# Patient Record
Sex: Female | Born: 1985 | Race: White | Hispanic: No | Marital: Married | Smoking: Never smoker
Health system: Southern US, Community
[De-identification: ages and names within clinical notes are randomized; demographics above are authoritative.]

## PROBLEM LIST (undated history)

## (undated) DIAGNOSIS — K219 Gastro-esophageal reflux disease without esophagitis: Secondary | ICD-10-CM

## (undated) DIAGNOSIS — Z87440 Personal history of urinary (tract) infections: Secondary | ICD-10-CM

## (undated) DIAGNOSIS — Z8619 Personal history of other infectious and parasitic diseases: Secondary | ICD-10-CM

## (undated) DIAGNOSIS — N6320 Unspecified lump in the left breast, unspecified quadrant: Secondary | ICD-10-CM

## (undated) HISTORY — DX: Unspecified lump in the left breast, unspecified quadrant: N63.20

## (undated) HISTORY — PX: TYMPANOSTOMY TUBE PLACEMENT: SHX32

## (undated) HISTORY — DX: Personal history of other infectious and parasitic diseases: Z86.19

## (undated) HISTORY — DX: Personal history of urinary (tract) infections: Z87.440

## (undated) HISTORY — DX: Gastro-esophageal reflux disease without esophagitis: K21.9

## (undated) HISTORY — PX: WISDOM TOOTH EXTRACTION: SHX21

---
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
## 2007-08-27 ENCOUNTER — Encounter: Admission: RE | Admit: 2007-08-27 | Discharge: 2007-08-27 | Payer: Self-pay | Admitting: Obstetrics and Gynecology

## 2008-03-17 ENCOUNTER — Encounter: Admission: RE | Admit: 2008-03-17 | Discharge: 2008-03-17 | Payer: Self-pay | Admitting: Obstetrics and Gynecology

## 2008-10-10 ENCOUNTER — Encounter: Admission: RE | Admit: 2008-10-10 | Discharge: 2008-10-10 | Payer: Self-pay | Admitting: Obstetrics and Gynecology

## 2010-09-04 ENCOUNTER — Other Ambulatory Visit: Payer: Self-pay | Admitting: Obstetrics and Gynecology

## 2010-09-10 ENCOUNTER — Other Ambulatory Visit: Payer: Self-pay

## 2010-09-12 ENCOUNTER — Other Ambulatory Visit: Payer: Self-pay

## 2010-09-26 ENCOUNTER — Ambulatory Visit
Admission: RE | Admit: 2010-09-26 | Discharge: 2010-09-26 | Disposition: A | Discharge status: Home / Self Care | Payer: BC Managed Care – PPO | Source: Ambulatory Visit | Attending: Obstetrics and Gynecology | Admitting: Obstetrics and Gynecology

## 2011-03-04 ENCOUNTER — Ambulatory Visit (INDEPENDENT_AMBULATORY_CARE_PROVIDER_SITE_OTHER): Payer: BC Managed Care – PPO | Admitting: Surgery

## 2011-03-04 ENCOUNTER — Encounter (INDEPENDENT_AMBULATORY_CARE_PROVIDER_SITE_OTHER): Payer: Self-pay | Admitting: Surgery

## 2011-03-04 DIAGNOSIS — N6320 Unspecified lump in the left breast, unspecified quadrant: Secondary | ICD-10-CM | POA: Insufficient documentation

## 2011-03-04 DIAGNOSIS — N63 Unspecified lump in unspecified breast: Secondary | ICD-10-CM

## 2011-03-04 HISTORY — DX: Unspecified lump in the left breast, unspecified quadrant: N63.20

## 2011-03-04 NOTE — Progress Notes (Signed)
Chief Complaint  Patient presents with  . Breast Mass    left breast mass - referral from Dr. Malva Limes    HISTORY: Patient is a 25 year old female followed for the past 3 years with a left breast mass. This had been noted on physical exam by her gynecologist. She has had sequential ultrasound scanning. She was evaluated at my surgical practice by one of my partners in 2010. 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  Patient notes that the mass has become gradually larger. It is becoming mildly symptomatic. She does not wish to continue ultrasound examinations. She desires surgical excision.  Patient has no prior history of breast disease. She denies nipple discharge. She has had no prior biopsies. Patient does have a sister with fibrocystic changes and fibroadenomas. Mother has had no breast disease.   No past medical history on file.   Current Outpatient Prescriptions  Medication Sig Dispense Refill  . Biotin 5 MG CAPS Take 5 mg by mouth daily.        Marland Kitchen ethynodiol-ethinyl estradiol (ZOVIA) 1-50 MG-MCG tablet Take 1 tablet by mouth daily.           No Known Allergies   Family History  Problem Relation Age of Onset  . Diabetes Maternal Grandmother   . Diabetes Paternal Grandfather   . Esophageal cancer Father      History   Social History  . Marital Status: Single    Spouse Name: N/A    Number of Children: N/A  . Years of Education: N/A   Social History Main Topics  . Smoking status: Never Smoker   . Smokeless tobacco: None  . Alcohol Use: Yes     very rarely  . Drug Use: No  . Sexually Active: None   Other Topics Concern  . None   Social History Narrative  . None     REVIEW OF SYSTEMS - PERTINENT POSITIVES ONLY: Mass is gradually enlarging. Mild discomfort. No nipple drainage.   EXAM: Filed Vitals:   03/04/11 1359  BP: 120/79  Pulse: 101    HEENT: normocephalic;  pupils equal and reactive; sclerae clear; dentition good; mucous membranes moist NECK:  No palpable nodules; symmetric on extension; no palpable anterior or posterior cervical lymphadenopathy; no supraclavicular masses; no tenderness CHEST: clear to auscultation bilaterally without rales, rhonchi, or wheezes CARDIAC: regular rate and rhythm without significant murmur; peripheral pulses are full EXT:  non-tender without edema; no deformity BREAST: Left breast shows normal nipple areolar complex. No cutaneous changes. Palpation shows the breast parenchyma to be diffusely nodular. There is a dominant mass in the upper outer quadrant measuring 3-4 cm in greatest dimension. It is mildly tender. It is mobile and discrete consistent with fibroadenoma area and right breast shows normal nipple areolar complex. The breast parenchyma is diffusely nodular without discrete or dominant mass. Bilateral axillae are without palpable adenopathy. NEURO: no gross focal deficits; no sign of tremor   LABORATORY RESULTS: See E-Chart for most recent results   RADIOLOGY RESULTS: See E-Chart or I-Site for most recent results   IMPRESSION: Left breast mass, upper outer quadrant, 3.8 cm, consistent with fibroadenoma. Mildly symptomatic.   PLAN: The patient and I and her mother discussed options for management. They have decided on surgical excision for definitive diagnosis and to avoid future diagnostic studies. We will make arrangements for outpatient surgery at [facility] at a time convenient for  the patient in the near future.  The risks and benefits of the procedure have been discussed at length with the patient.  The patient understands the proposed procedure, potential alternative treatments, and the course of recovery to be expected.  All of the patient's questions have been answered at this time.  The patient wishes to proceed with surgery and will schedule a date for their procedure through  our office staff.   Velora Heckler, MD, FACS General & Endocrine Surgery [facility]      Visit Diagnoses: 1. Breast mass, left     Primary Care Physician: No primary provider on file.

## 2011-04-30 ENCOUNTER — Encounter (INDEPENDENT_AMBULATORY_CARE_PROVIDER_SITE_OTHER): Payer: Self-pay | Admitting: Surgery

## 2011-05-09 ENCOUNTER — Ambulatory Visit (HOSPITAL_COMMUNITY): Admission: RE | Admit: 2011-05-09 | Payer: BC Managed Care – PPO | Source: Ambulatory Visit | Admitting: Surgery

## 2011-05-09 ENCOUNTER — Encounter (HOSPITAL_COMMUNITY): Admission: RE | Payer: Self-pay | Source: Ambulatory Visit

## 2011-05-09 SURGERY — BREAST BIOPSY
Anesthesia: General | Laterality: Left

## 2011-06-03 HISTORY — PX: OTHER SURGICAL HISTORY: SHX169

## 2011-06-19 ENCOUNTER — Other Ambulatory Visit (HOSPITAL_COMMUNITY)
Admission: RE | Admit: 2011-06-19 | Discharge: 2011-06-19 | Disposition: A | Discharge status: Home / Self Care | Payer: BC Managed Care – PPO | Source: Ambulatory Visit | Attending: Surgery | Admitting: Surgery

## 2011-06-19 ENCOUNTER — Other Ambulatory Visit (INDEPENDENT_AMBULATORY_CARE_PROVIDER_SITE_OTHER): Payer: Self-pay | Admitting: Surgery

## 2011-06-19 DIAGNOSIS — D249 Benign neoplasm of unspecified breast: Secondary | ICD-10-CM

## 2011-06-23 ENCOUNTER — Encounter (HOSPITAL_COMMUNITY): Admission: RE | Payer: Self-pay | Source: Ambulatory Visit

## 2011-06-23 ENCOUNTER — Ambulatory Visit (HOSPITAL_COMMUNITY): Admission: RE | Admit: 2011-06-23 | Payer: BC Managed Care – PPO | Source: Ambulatory Visit | Admitting: Surgery

## 2011-06-23 SURGERY — EXCISION OF BREAST BIOPSY
Anesthesia: General | Site: Breast

## 2011-06-25 NOTE — Progress Notes (Signed)
Results given patient aware.

## 2011-06-25 NOTE — Progress Notes (Signed)
Quick Note:  Please contact patient with benign path results. TMG ______ 

## 2011-07-01 ENCOUNTER — Encounter (INDEPENDENT_AMBULATORY_CARE_PROVIDER_SITE_OTHER): Payer: Self-pay | Admitting: Surgery

## 2011-07-01 ENCOUNTER — Ambulatory Visit (INDEPENDENT_AMBULATORY_CARE_PROVIDER_SITE_OTHER): Payer: BC Managed Care – PPO | Admitting: Surgery

## 2011-07-01 VITALS — BP 98/62 | HR 75 | Temp 97.4°F | Resp 16 | Ht 61.0 in | Wt 176.8 lb

## 2011-07-01 DIAGNOSIS — N6320 Unspecified lump in the left breast, unspecified quadrant: Secondary | ICD-10-CM

## 2011-07-01 DIAGNOSIS — N63 Unspecified lump in unspecified breast: Secondary | ICD-10-CM

## 2011-07-01 NOTE — Patient Instructions (Signed)
  COCOA BUTTER & VITAMIN E CREAM  ([employer] or other brand)  Apply cocoa butter/vitamin E cream to your incision 2 - 3 times daily.  Massage cream into incision for one minute with each application.  Use sunscreen (50 SPF or higher) for first 6 months after surgery if area is exposed to sun.  You may substitute Mederma or other scar reducing creams as desired.   

## 2011-07-01 NOTE — Progress Notes (Signed)
Visit Diagnoses: 1. Breast mass, left     HISTORY: Patient is a 26 year old white female who underwent left breast excisional biopsy for a large fibroadenoma. 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 Postoperative course has been uncomplicated.  EXAM: Surgical wound is well-healed. Remaining Steri-Strips are removed. No sign of seroma. No sign of infection.  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 She is released at full activity without restriction. She will return to see me in this office as needed.  Velora Heckler, MD, FACS General & Endocrine Surgery [facility]

## 2011-08-04 ENCOUNTER — Encounter (INDEPENDENT_AMBULATORY_CARE_PROVIDER_SITE_OTHER): Payer: BC Managed Care – PPO | Admitting: Surgery

## 2011-08-07 ENCOUNTER — Encounter (INDEPENDENT_AMBULATORY_CARE_PROVIDER_SITE_OTHER): Payer: Self-pay | Admitting: Surgery

## 2014-10-03 ENCOUNTER — Inpatient Hospital Stay (HOSPITAL_COMMUNITY): Admission: AD | Admit: 2014-10-03 | Payer: Self-pay | Source: Ambulatory Visit | Admitting: Obstetrics and Gynecology

## 2015-03-07 LAB — OB RESULTS CONSOLE HEPATITIS B SURFACE ANTIGEN: Hepatitis B Surface Ag: NEGATIVE

## 2015-03-07 LAB — OB RESULTS CONSOLE HIV ANTIBODY (ROUTINE TESTING): HIV: NONREACTIVE

## 2015-03-07 LAB — OB RESULTS CONSOLE ABO/RH: RH TYPE: POSITIVE

## 2015-03-07 LAB — OB RESULTS CONSOLE RUBELLA ANTIBODY, IGM: Rubella: IMMUNE

## 2015-03-07 LAB — OB RESULTS CONSOLE RPR: RPR: NONREACTIVE

## 2015-03-07 LAB — OB RESULTS CONSOLE GC/CHLAMYDIA
CHLAMYDIA, DNA PROBE: NEGATIVE
Gonorrhea: NEGATIVE

## 2015-03-07 LAB — OB RESULTS CONSOLE ANTIBODY SCREEN: Antibody Screen: NEGATIVE

## 2015-06-03 NOTE — L&D Delivery Note (Signed)
Pt had a prolonged labor curve. She pushed for 1 1/2 hours and had a SVD of one live viable infant over a 4th degree midline tear in the LOA position. Nuchal cord x 1. Placenta S/I. Baby came out crying. After cord was cut the baby was placed on the mother's abd. Then the baby's heart rate declined the baby became flaccid. Code Apgar was called. Placenta S/I. Tear closed with 3-0 chromic and 2-0 vicryl. Double layer closure on the rectal tear. Baby to NBN. [id number]

## 2015-09-05 LAB — OB RESULTS CONSOLE GBS: GBS: POSITIVE

## 2015-10-05 ENCOUNTER — Other Ambulatory Visit: Payer: Self-pay | Admitting: Obstetrics and Gynecology

## 2015-10-05 ENCOUNTER — Telehealth (HOSPITAL_COMMUNITY): Payer: Self-pay | Admitting: *Deleted

## 2015-10-05 NOTE — Telephone Encounter (Signed)
Preadmission screen  

## 2015-10-08 ENCOUNTER — Encounter (HOSPITAL_COMMUNITY): Payer: Self-pay | Admitting: *Deleted

## 2015-10-09 ENCOUNTER — Inpatient Hospital Stay (HOSPITAL_COMMUNITY)
Admission: RE | Admit: 2015-10-09 | Discharge: 2015-10-12 | DRG: 775 | Disposition: A | Discharge status: Home / Self Care | Payer: BLUE CROSS/BLUE SHIELD | Source: Ambulatory Visit | Attending: Obstetrics and Gynecology | Admitting: Obstetrics and Gynecology

## 2015-10-09 ENCOUNTER — Inpatient Hospital Stay (HOSPITAL_COMMUNITY): Payer: BLUE CROSS/BLUE SHIELD | Admitting: Anesthesiology

## 2015-10-09 VITALS — BP 107/71 | HR 87 | Temp 98.4°F | Resp 18 | Ht 62.0 in | Wt 208.0 lb

## 2015-10-09 DIAGNOSIS — O480 Post-term pregnancy: Secondary | ICD-10-CM | POA: Diagnosis present

## 2015-10-09 DIAGNOSIS — Z3490 Encounter for supervision of normal pregnancy, unspecified, unspecified trimester: Secondary | ICD-10-CM

## 2015-10-09 DIAGNOSIS — O99214 Obesity complicating childbirth: Secondary | ICD-10-CM | POA: Diagnosis present

## 2015-10-09 DIAGNOSIS — Z3A40 40 weeks gestation of pregnancy: Secondary | ICD-10-CM | POA: Diagnosis not present

## 2015-10-09 DIAGNOSIS — O99824 Streptococcus B carrier state complicating childbirth: Secondary | ICD-10-CM | POA: Diagnosis present

## 2015-10-09 DIAGNOSIS — E669 Obesity, unspecified: Secondary | ICD-10-CM | POA: Diagnosis present

## 2015-10-09 DIAGNOSIS — Z6838 Body mass index (BMI) 38.0-38.9, adult: Secondary | ICD-10-CM

## 2015-10-09 LAB — TYPE AND SCREEN
ABO/RH(D): O POS
Antibody Screen: NEGATIVE

## 2015-10-09 LAB — CBC
HEMATOCRIT: 38.8 % (ref 36.0–46.0)
HEMOGLOBIN: 13.7 g/dL (ref 12.0–15.0)
MCH: 31.1 pg (ref 26.0–34.0)
MCHC: 35.3 g/dL (ref 30.0–36.0)
MCV: 88.2 fL (ref 78.0–100.0)
Platelets: 309 10*3/uL (ref 150–400)
RBC: 4.4 MIL/uL (ref 3.87–5.11)
RDW: 13.4 % (ref 11.5–15.5)
WBC: 9.9 10*3/uL (ref 4.0–10.5)

## 2015-10-09 LAB — RPR: RPR Ser Ql: NONREACTIVE

## 2015-10-09 LAB — ABO/RH: ABO/RH(D): O POS

## 2015-10-09 MED ORDER — LACTATED RINGERS IV SOLN
1.0000 m[IU]/min | INTRAVENOUS | Status: DC
  Administered 2015-10-09: 2 m[IU]/min via INTRAVENOUS
  Administered 2015-10-10: 8 m[IU]/min via INTRAVENOUS
  Filled 2015-10-09 (×2): qty 4

## 2015-10-09 MED ORDER — TERBUTALINE SULFATE 1 MG/ML IJ SOLN
0.2500 mg | Freq: Once | INTRAMUSCULAR | Status: AC | PRN
  Administered 2015-10-10: 0.25 mg via SUBCUTANEOUS
  Filled 2015-10-09: qty 1

## 2015-10-09 MED ORDER — CITRIC ACID-SODIUM CITRATE 334-500 MG/5ML PO SOLN
30.0000 mL | ORAL | Status: DC | PRN
  Filled 2015-10-09: qty 15

## 2015-10-09 MED ORDER — LACTATED RINGERS IV SOLN
INTRAVENOUS | Status: DC
  Administered 2015-10-09 – 2015-10-10 (×4): via INTRAVENOUS

## 2015-10-09 MED ORDER — LIDOCAINE HCL (PF) 1 % IJ SOLN
INTRAMUSCULAR | Status: DC | PRN
  Administered 2015-10-09: 4 mL

## 2015-10-09 MED ORDER — OXYTOCIN BOLUS FROM INFUSION
500.0000 mL | INTRAVENOUS | Status: DC
Start: 2015-10-09 — End: 2015-10-10
  Administered 2015-10-10: 500 mL via INTRAVENOUS

## 2015-10-09 MED ORDER — PENICILLIN G POTASSIUM 5000000 UNITS IJ SOLR
5.0000 10*6.[IU] | Freq: Once | INTRAVENOUS | Status: AC
  Administered 2015-10-09: 5 10*6.[IU] via INTRAVENOUS
  Filled 2015-10-09: qty 5

## 2015-10-09 MED ORDER — LACTATED RINGERS IV SOLN
500.0000 mL | INTRAVENOUS | Status: DC | PRN
  Administered 2015-10-10: 500 mL via INTRAVENOUS

## 2015-10-09 MED ORDER — MISOPROSTOL 25 MCG QUARTER TABLET
25.0000 ug | ORAL_TABLET | ORAL | Status: DC | PRN
  Administered 2015-10-09: 25 ug via VAGINAL
  Filled 2015-10-09: qty 0.25
  Filled 2015-10-09: qty 1

## 2015-10-09 MED ORDER — OXYTOCIN 10 UNIT/ML IJ SOLN
2.5000 [IU]/h | INTRAVENOUS | Status: DC
  Administered 2015-10-10: 39.96 [IU]/h via INTRAVENOUS

## 2015-10-09 MED ORDER — PENICILLIN G POTASSIUM 5000000 UNITS IJ SOLR
2.5000 10*6.[IU] | INTRAVENOUS | Status: DC
  Administered 2015-10-09 – 2015-10-10 (×7): 2.5 10*6.[IU] via INTRAVENOUS
  Filled 2015-10-09 (×9): qty 2.5

## 2015-10-09 MED ORDER — DIPHENHYDRAMINE HCL 50 MG/ML IJ SOLN: 12.5000 mg | INTRAMUSCULAR | Status: DC | PRN

## 2015-10-09 MED ORDER — ONDANSETRON HCL 4 MG/2ML IJ SOLN
4.0000 mg | Freq: Four times a day (QID) | INTRAMUSCULAR | Status: DC | PRN
  Administered 2015-10-09: 4 mg via INTRAVENOUS
  Filled 2015-10-09: qty 2

## 2015-10-09 MED ORDER — FENTANYL 2.5 MCG/ML BUPIVACAINE 1/10 % EPIDURAL INFUSION (WH - ANES)
14.0000 mL/h | INTRAMUSCULAR | Status: DC | PRN
  Administered 2015-10-09 – 2015-10-10 (×5): 14 mL/h via EPIDURAL
  Filled 2015-10-09 (×4): qty 125

## 2015-10-09 MED ORDER — SODIUM CHLORIDE 0.9 % IV SOLN: 250.0000 mL | INTRAVENOUS | Status: DC | PRN

## 2015-10-09 MED ORDER — OXYCODONE-ACETAMINOPHEN 5-325 MG PO TABS
2.0000 | ORAL_TABLET | ORAL | Status: DC | PRN
  Administered 2015-10-10: 2 via ORAL
  Filled 2015-10-09: qty 2

## 2015-10-09 MED ORDER — SODIUM CHLORIDE 0.9% FLUSH: 3.0000 mL | Freq: Two times a day (BID) | INTRAVENOUS | Status: DC

## 2015-10-09 MED ORDER — PHENYLEPHRINE 40 MCG/ML (10ML) SYRINGE FOR IV PUSH (FOR BLOOD PRESSURE SUPPORT)
80.0000 ug | PREFILLED_SYRINGE | INTRAVENOUS | Status: DC | PRN
  Administered 2015-10-09: 80 ug via INTRAVENOUS
  Filled 2015-10-09: qty 5

## 2015-10-09 MED ORDER — BUPIVACAINE HCL (PF) 0.25 % IJ SOLN
INTRAMUSCULAR | Status: DC | PRN
  Administered 2015-10-09 – 2015-10-10 (×4): 4 mL via EPIDURAL

## 2015-10-09 MED ORDER — BUTORPHANOL TARTRATE 1 MG/ML IJ SOLN
1.0000 mg | INTRAMUSCULAR | Status: DC | PRN
  Administered 2015-10-09 – 2015-10-10 (×2): 1 mg via INTRAVENOUS
  Filled 2015-10-09 (×2): qty 1

## 2015-10-09 MED ORDER — SODIUM CHLORIDE 0.9% FLUSH: 3.0000 mL | INTRAVENOUS | Status: DC | PRN

## 2015-10-09 MED ORDER — FLEET ENEMA 7-19 GM/118ML RE ENEM: 1.0000 | ENEMA | RECTAL | Status: DC | PRN

## 2015-10-09 MED ORDER — ZOLPIDEM TARTRATE 5 MG PO TABS: 5.0000 mg | ORAL_TABLET | Freq: Every evening | ORAL | Status: DC | PRN

## 2015-10-09 MED ORDER — LACTATED RINGERS IV SOLN
500.0000 mL | Freq: Once | INTRAVENOUS | Status: AC
  Administered 2015-10-09: 500 mL via INTRAVENOUS

## 2015-10-09 MED ORDER — FENTANYL CITRATE (PF) 100 MCG/2ML IJ SOLN
INTRAMUSCULAR | Status: DC | PRN
  Administered 2015-10-09 – 2015-10-10 (×4): 50 ug via EPIDURAL

## 2015-10-09 MED ORDER — OXYCODONE-ACETAMINOPHEN 5-325 MG PO TABS: 1.0000 | ORAL_TABLET | ORAL | Status: DC | PRN

## 2015-10-09 MED ORDER — LIDOCAINE HCL (PF) 1 % IJ SOLN
30.0000 mL | INTRAMUSCULAR | Status: AC | PRN
  Administered 2015-10-10: 30 mL via SUBCUTANEOUS
  Filled 2015-10-09: qty 30

## 2015-10-09 MED ORDER — FENTANYL CITRATE (PF) 100 MCG/2ML IJ SOLN
INTRAMUSCULAR | Status: AC
  Filled 2015-10-09: qty 2

## 2015-10-09 MED ORDER — EPHEDRINE 5 MG/ML INJ
10.0000 mg | INTRAVENOUS | Status: DC | PRN
  Filled 2015-10-09: qty 2

## 2015-10-09 MED ORDER — ACETAMINOPHEN 325 MG PO TABS: 650.0000 mg | ORAL_TABLET | ORAL | Status: DC | PRN

## 2015-10-09 MED ORDER — PHENYLEPHRINE 40 MCG/ML (10ML) SYRINGE FOR IV PUSH (FOR BLOOD PRESSURE SUPPORT)
80.0000 ug | PREFILLED_SYRINGE | INTRAVENOUS | Status: DC | PRN
  Filled 2015-10-09: qty 10
  Filled 2015-10-09: qty 5

## 2015-10-09 MED ORDER — SODIUM BICARBONATE 8.4 % IV SOLN
INTRAVENOUS | Status: DC | PRN
  Administered 2015-10-09: 3 mL via EPIDURAL
  Administered 2015-10-10 (×2): 5 mL via EPIDURAL

## 2015-10-09 NOTE — Anesthesia Pain Management Evaluation Note (Signed)
  CRNA Pain Management Visit Note  Patient: Jacqueline Davidson, 30 y.o., female  "Hello I am a member of the anesthesia team at [facility]. We have an anesthesia team available at all times to provide care throughout the hospital, including epidural management and anesthesia for C-section. I don't know your plan for the delivery whether it a natural birth, water birth, IV sedation, nitrous supplementation, doula or epidural, but we want to meet your pain goals."   1.Was your pain managed to your expectations on prior hospitalizations?   Yes   2.What is your expectation for pain management during this hospitalization?     Epidural  3.How can we help you reach that goal?   Record the patient's initial score and the patient's pain goal.   Pain: 0  Pain Goal: 5 [facility] wants you to be able to say your pain was always managed very well.  Dorthia Tout 10/09/2015

## 2015-10-09 NOTE — Anesthesia Preprocedure Evaluation (Signed)
Anesthesia Evaluation  Patient identified by MRN, date of birth, ID band Patient awake    Reviewed: Allergy & Precautions, NPO status , Patient's Chart, lab work & pertinent test results  History of Anesthesia Complications Negative for: history of anesthetic complications  Airway Mallampati: II  TM Distance: >3 FB Neck ROM: Full    Dental no notable dental hx. (+) Dental Advisory Given   Pulmonary neg pulmonary ROS,    Pulmonary exam normal breath sounds clear to auscultation       Cardiovascular negative cardio ROS Normal cardiovascular exam Rhythm:Regular Rate:Normal     Neuro/Psych negative neurological ROS  negative psych ROS   GI/Hepatic negative GI ROS, Neg liver ROS,   Endo/Other  obesity  Renal/GU negative Renal ROS  negative genitourinary   Musculoskeletal negative musculoskeletal ROS (+)   Abdominal   Peds negative pediatric ROS (+)  Hematology negative hematology ROS (+)   Anesthesia Other Findings   Reproductive/Obstetrics (+) Pregnancy                             Anesthesia Physical Anesthesia Plan  ASA: II  Anesthesia Plan: Epidural   Post-op Pain Management:    Induction:   Airway Management Planned:   Additional Equipment:   Intra-op Plan:   Post-operative Plan:   Informed Consent: I have reviewed the patients History and Physical, chart, labs and discussed the procedure including the risks, benefits and alternatives for the proposed anesthesia with the patient or authorized representative who has indicated his/her understanding and acceptance.   Dental advisory given  Plan Discussed with: CRNA  Anesthesia Plan Comments:         Anesthesia Quick Evaluation

## 2015-10-09 NOTE — Progress Notes (Signed)
Dr Philis Pique called for an update andRN told Dr Philis Pique that pt having some pelvic and buttox pressure; sve at 1830=7/90/-2; fhr reassurring with early decels. Dr said to call when needed for delivery.

## 2015-10-09 NOTE — Anesthesia Procedure Notes (Signed)
Epidural Patient location during procedure: OB  Staffing Anesthesiologist: Turon Kilmer Performed by: anesthesiologist   Preanesthetic Checklist Completed: patient identified, site marked, surgical consent, pre-op evaluation, timeout performed, IV checked, risks and benefits discussed and monitors and equipment checked  Epidural Patient position: sitting Prep: site prepped and draped and DuraPrep Patient monitoring: continuous pulse ox and blood pressure Approach: midline Location: L3-L4 Injection technique: LOR saline  Needle:  Needle type: Tuohy  Needle gauge: 17 G Needle length: 9 cm and 9 Needle insertion depth: 7 cm Catheter type: closed end flexible Catheter size: 19 Gauge Catheter at skin depth: 11 cm Test dose: negative  Assessment Events: blood not aspirated, injection not painful, no injection resistance, negative IV test and no paresthesia  Additional Notes Patient identified. Risks/Benefits/Options discussed with patient including but not limited to bleeding, infection, nerve damage, paralysis, failed block, incomplete pain control, headache, blood pressure changes, nausea, vomiting, reactions to medication both or allergic, itching and postpartum back pain. Confirmed with bedside nurse the patient's most recent platelet count. Confirmed with patient that they are not currently taking any anticoagulation, have any bleeding history or any family history of bleeding disorders. Patient expressed understanding and wished to proceed. All questions were answered. Sterile technique was used throughout the entire procedure. Please see nursing notes for vital signs. Test dose was given through epidural catheter and negative prior to continuing to dose epidural or start infusion. Warning signs of high block given to the patient including shortness of breath, tingling/numbness in hands, complete motor block, or any concerning symptoms with instructions to call for help. Patient was  given instructions on fall risk and not to get out of bed. All questions and concerns addressed with instructions to call with any issues or inadequate analgesia.    First attempt at epidural placement with LOR at 7 but unable to pass epidural catheter out of the end of the tuohy. Resistance encountered and did not want to force catheter. Therefore, moved to space above and repeated with LOR again at 7cm. Some heme noted in catheter immediately after threading which cleared with flushing and withdrawing catheter to 11cm which is 4cm in the epidural space. Lidocaine with epinephrine used to test catheter and no change in maternal heart rate. Again negative aspiration for heme. Therefore will move forward with using this epidural catheter. Patient also reports good analgesia after placement.

## 2015-10-09 NOTE — Progress Notes (Signed)
Late entry; at [place] also told Dr Philis Pique that pt's MVU's adequate since 1600

## 2015-10-09 NOTE — H&P (Signed)
30 y.o. [redacted]w[redacted]d  G1P0 comes in for post dates induction.  Otherwise has good fetal movement and no bleeding.  Past Medical History  Diagnosis Date  . Left breast mass   . Hx of varicella     Past Surgical History  Procedure Laterality Date  . Tympanostomy tube placement  31 years old  . Left breast lumpectomy  06/03/2011    no evidence of malignancy    OB History  Gravida Para Term Preterm AB SAB TAB Ectopic Multiple Living  1             # Outcome Date GA Lbr Len/2nd Weight Sex Delivery Anes PTL Lv  1 Current               Social History   Social History  . Marital Status: Married    Spouse Name: N/A  . Number of Children: N/A  . Years of Education: N/A   Occupational History  . Not on file.   Social History Main Topics  . Smoking status: Never Smoker   . Smokeless tobacco: Never Used  . Alcohol Use: No     Comment: very rarely  . Drug Use: No  . Sexual Activity: Not on file   Other Topics Concern  . Not on file   Social History Narrative   Review of patient's allergies indicates no known allergies.    Prenatal Transfer Tool  Maternal Diabetes: No Genetic Screening: Declined Maternal Ultrasounds/Referrals: Normal Fetal Ultrasounds or other Referrals:  None Maternal Substance Abuse:  No Significant Maternal Medications:  None Significant Maternal Lab Results: Lab values include: Group B Strep positive  Other PNC: uncomplicated.    Filed Vitals:   10/09/15 0652 10/09/15 0700  BP: 129/90 128/85  Pulse: 86 81  Temp:    Resp: 17 16     Lungs/Cor:  NAD Abdomen:  soft, gravid Ex:  no cords, erythema SVE:  2/60/-2 FHTs:  120s, good STV, NST R Toco:  q 3-5   A/P   Post dates induction.  GBS POS- PCN.  Maliek Schellhorn A

## 2015-10-10 ENCOUNTER — Encounter (HOSPITAL_COMMUNITY): Payer: Self-pay

## 2015-10-10 DIAGNOSIS — Z3490 Encounter for supervision of normal pregnancy, unspecified, unspecified trimester: Secondary | ICD-10-CM

## 2015-10-10 MED ORDER — DOCUSATE SODIUM 100 MG PO CAPS
100.0000 mg | ORAL_CAPSULE | Freq: Two times a day (BID) | ORAL | Status: DC
  Administered 2015-10-10 – 2015-10-11 (×3): 100 mg via ORAL
  Filled 2015-10-10 (×3): qty 1

## 2015-10-10 MED ORDER — ONDANSETRON HCL 4 MG PO TABS: 4.0000 mg | ORAL_TABLET | ORAL | Status: DC | PRN

## 2015-10-10 MED ORDER — SENNOSIDES-DOCUSATE SODIUM 8.6-50 MG PO TABS: 2.0000 | ORAL_TABLET | ORAL | Status: DC

## 2015-10-10 MED ORDER — TETANUS-DIPHTH-ACELL PERTUSSIS 5-2.5-18.5 LF-MCG/0.5 IM SUSP: 0.5000 mL | Freq: Once | INTRAMUSCULAR | Status: DC

## 2015-10-10 MED ORDER — ACETAMINOPHEN 325 MG PO TABS: 650.0000 mg | ORAL_TABLET | ORAL | Status: DC | PRN

## 2015-10-10 MED ORDER — DIBUCAINE 1 % RE OINT: 1.0000 "application " | TOPICAL_OINTMENT | RECTAL | Status: DC | PRN

## 2015-10-10 MED ORDER — IBUPROFEN 600 MG PO TABS
600.0000 mg | ORAL_TABLET | Freq: Four times a day (QID) | ORAL | Status: DC
Start: 2015-10-10 — End: 2015-10-12
  Administered 2015-10-10 – 2015-10-11 (×6): 600 mg via ORAL
  Filled 2015-10-10 (×7): qty 1

## 2015-10-10 MED ORDER — ONDANSETRON HCL 4 MG/2ML IJ SOLN: 4.0000 mg | INTRAMUSCULAR | Status: DC | PRN

## 2015-10-10 MED ORDER — COCONUT OIL OIL: 1.0000 "application " | TOPICAL_OIL | Status: DC | PRN

## 2015-10-10 MED ORDER — ZOLPIDEM TARTRATE 5 MG PO TABS: 5.0000 mg | ORAL_TABLET | Freq: Every evening | ORAL | Status: DC | PRN

## 2015-10-10 MED ORDER — FENTANYL CITRATE (PF) 100 MCG/2ML IJ SOLN
INTRAMUSCULAR | Status: AC
  Filled 2015-10-10: qty 2

## 2015-10-10 MED ORDER — MEASLES, MUMPS & RUBELLA VAC ~~LOC~~ INJ: 0.5000 mL | INJECTION | Freq: Once | SUBCUTANEOUS | Status: DC

## 2015-10-10 MED ORDER — SIMETHICONE 80 MG PO CHEW: 80.0000 mg | CHEWABLE_TABLET | ORAL | Status: DC | PRN

## 2015-10-10 MED ORDER — WITCH HAZEL-GLYCERIN EX PADS: 1.0000 "application " | MEDICATED_PAD | CUTANEOUS | Status: DC | PRN

## 2015-10-10 MED ORDER — PRENATAL MULTIVITAMIN CH: 1.0000 | ORAL_TABLET | Freq: Every day | ORAL | Status: DC

## 2015-10-10 MED ORDER — BENZOCAINE-MENTHOL 20-0.5 % EX AERO: 1.0000 "application " | INHALATION_SPRAY | CUTANEOUS | Status: DC | PRN

## 2015-10-10 MED ORDER — DIPHENHYDRAMINE HCL 25 MG PO CAPS: 25.0000 mg | ORAL_CAPSULE | Freq: Four times a day (QID) | ORAL | Status: DC | PRN

## 2015-10-10 MED ORDER — BENZOCAINE-MENTHOL 20-0.5 % EX AERO
1.0000 "application " | INHALATION_SPRAY | CUTANEOUS | Status: DC | PRN
  Administered 2015-10-10: 1 via TOPICAL
  Filled 2015-10-10: qty 56

## 2015-10-10 MED ORDER — IBUPROFEN 600 MG PO TABS
600.0000 mg | ORAL_TABLET | Freq: Four times a day (QID) | ORAL | Status: DC
  Administered 2015-10-10: 600 mg via ORAL
  Filled 2015-10-10: qty 1

## 2015-10-10 NOTE — Lactation Note (Addendum)
This note was copied from a baby's chart. Lactation Consultation Note Initial visit at 9 hours of age.  Baby has not had a feedings and delivery was complicated with a code apgar and PPV.  Mom will have to return to work in 65 weeks and feels she will not be able to pump at work.  Mom wants to begin pumping and bottle feeding and is ok with giving formula as well.  Mom is aware she isn't likely to express a lot of colostrum now and is ok with working on latching.  Mom is aware of difficulties her sister had with a nipple shield and feels it would be to frustrating to her to use.  Mom had been told by RN she may need to use a nipple shield.  Mom has large full breast with compressible breasts, but semi flat nipples with short shaft.  Mom is able to express a few drops with hand expression.  LC allowed baby to suck on gloved finger and was not very coordinated with sucking, but showing feeding cues.  Baby chomps and bites on finger.  Baby place STS with mom and applied several drops of EBM to baby's mouth with gloved finger.  Baby assisted with cross cradle hold and baby latched well and soon began a more rhythmic sucking pattern with swallows audible.  Mom denies pain with latch. Minimal stimulation needed to maintain feeding over 15 minutes.  Moms nipple is round and slightly everted after feeding.  Discussed with mom pump use every 3 hours on initiation phase for 15 min followed by hand expression.  RN at bedside to assess mom and will assist with DEBP set up.  Mom may begin supplementing with formula in a bottle soon but was engaging in discussion and encouragement to offer exclusive breastfeeding and delaying supplementation and artificial nipples. [facility] LC resources given and discussed.  Encouraged to feed with early cues on demand.  Early newborn behavior discussed. Mom to call for assist as needed.  Mom has a history of left breast mass removed about 6 years ago.  Scar is approx 1 and 1/2 inches long on upper  outer aspect of breast about 1 inch from areaola.  Mom denies any complications with this.      Patient Name: Jacqueline Davidson [id number] Date: 10/10/2015 Reason for consult: Initial assessment   Maternal Data Has patient been taught Hand Expression?: Yes Does the patient have breastfeeding experience prior to this delivery?: No  Feeding Feeding Type: Breast Fed Length of feed: 15 min  LATCH Score/Interventions Latch: Repeated attempts needed to sustain latch, nipple held in mouth throughout feeding, stimulation needed to elicit sucking reflex. Intervention(s): Adjust position;Assist with latch;Breast massage;Breast compression  Audible Swallowing: Spontaneous and intermittent  Type of Nipple: Flat (short shaft) Intervention(s): Double electric pump  Comfort (Breast/Nipple): Soft / non-tender     Hold (Positioning): Assistance needed to correctly position infant at breast and maintain latch. Intervention(s): Breastfeeding basics reviewed;Support Pillows;Position options;Skin to skin  LATCH Score: 7  Lactation Tools Discussed/Used WIC Program: No Pump Review: Milk Storage Date initiated:: 10/10/15   Consult Status Consult Status: Follow-up Date: 10/11/15 Follow-up type: In-patient    Justice Britain 10/10/2015, 6:25 PM

## 2015-10-10 NOTE — Progress Notes (Signed)
Pt has had occ mild to moderate variables but mostly earlies with good STV and accels.  +Scalp stim. Now C/C/+1 and pushing.

## 2015-10-10 NOTE — Progress Notes (Signed)
Dr. Ouida Sills informed that patient had a Foley catheter post 4th degree repair and that the patient's urine is blood-tinged and concentrated. Dr. Ouida Sills stated that the Foley catheter can be removed at this time.

## 2015-10-10 NOTE — Anesthesia Postprocedure Evaluation (Signed)
Anesthesia Post Note  Patient: Jacqueline Davidson  Procedure(s) Performed: * No procedures listed *  Patient location during evaluation: Mother Baby Anesthesia Type: Epidural Level of consciousness: awake, awake and alert, oriented and patient uncooperative Pain management: pain level controlled Vital Signs Assessment: post-procedure vital signs reviewed and stable Respiratory status: spontaneous breathing, nonlabored ventilation and respiratory function stable Cardiovascular status: stable Postop Assessment: no headache, no backache, patient able to bend at knees and no signs of nausea or vomiting Anesthetic complications: no     Last Vitals:  Filed Vitals:   10/10/15 1130 10/10/15 1206  BP: 117/97 121/72  Pulse: 82 86  Temp:  36.9 C  Resp: 18 16    Last Pain:  Filed Vitals:   10/10/15 1209  PainSc: 6    Pain Goal: Patients Stated Pain Goal: 4 (10/10/15 1207)               Romaldo Saville L

## 2015-10-11 ENCOUNTER — Encounter (HOSPITAL_COMMUNITY): Payer: Self-pay

## 2015-10-11 LAB — CBC
HCT: 29 % — ABNORMAL LOW (ref 36.0–46.0)
Hemoglobin: 10 g/dL — ABNORMAL LOW (ref 12.0–15.0)
MCH: 30.8 pg (ref 26.0–34.0)
MCHC: 34.5 g/dL (ref 30.0–36.0)
MCV: 89.2 fL (ref 78.0–100.0)
PLATELETS: 256 10*3/uL (ref 150–400)
RBC: 3.25 MIL/uL — ABNORMAL LOW (ref 3.87–5.11)
RDW: 14 % (ref 11.5–15.5)
WBC: 11.5 10*3/uL — AB (ref 4.0–10.5)

## 2015-10-11 NOTE — Progress Notes (Signed)
Parents escorted to NICU upon admission to NICU with Neo.

## 2015-10-11 NOTE — Lactation Note (Signed)
This note was copied from a baby's chart. Lactation Consultation Note  Follow up visit made to assist with latch.  Mom does not want to breastfeed this feeding.  Attempted to bottle feed baby with a slow flow nipple and special needs feeder.  Baby would not suck on nipple and let formula run out of her mouth.  Baby would suck on gloved finger with much effort.  Attempted to finger feed baby and baby would suck weakly but very few swallows.  Poor seal on finger.  Baby took 5 mls of formula and then spit.  Mom states baby also spit after AM feeding.  Report given to [facility] RN and Designer, industrial/product.  Patient Name: Jacqueline Davidson [id number] Date: 10/11/2015 Reason for consult: Follow-up assessment;Difficult latch   Maternal Data    Feeding Feeding Type: Breast Fed  LATCH Score/Interventions Latch: Too sleepy or reluctant, no latch achieved, no sucking elicited. Intervention(s): Skin to skin;Teach feeding cues;Waking techniques Intervention(s): Breast compression;Breast massage;Assist with latch;Adjust position  Audible Swallowing: None  Type of Nipple: Everted at rest and after stimulation  Comfort (Breast/Nipple): Soft / non-tender     Hold (Positioning): Assistance needed to correctly position infant at breast and maintain latch. Intervention(s): Breastfeeding basics reviewed;Support Pillows;Position options;Skin to skin  LATCH Score: 5  Lactation Tools Discussed/Used     Consult Status Consult Status: Follow-up Date: 10/12/15 Follow-up type: In-patient    Ave Filter 10/11/2015, 2:38 PM

## 2015-10-11 NOTE — Lactation Note (Signed)
This note was copied from a baby's chart. Lactation Consultation Note  Follow up visit made.  Baby has not been latching and having a difficult time with sucking per bottle or finger.  Mom just pumped 3 mls of colostrum.  Attempted latching baby to breast.  Mom has a short nipple and areola difficult to compress.  Baby unable to latch with several attempts.  20 mm nipple shield applied and prefilled with formula.  Baby held nipple shield in her mouth and fell asleep.  Baby did start sucking well on gloved finger after a few minutes and took 3 mls of colostrum and 8 mls of formula.  Baby will be started under double phototherapy. Mom will pump breasts every 2 hours.  I will assist with next feeding.  Patient Name: Jacqueline Davidson [id number] Date: 10/11/2015 Reason for consult: Follow-up assessment   Maternal Data    Feeding Feeding Type: Breast Fed  LATCH Score/Interventions Latch: Too sleepy or reluctant, no latch achieved, no sucking elicited. Intervention(s): Skin to skin;Teach feeding cues;Waking techniques Intervention(s): Breast compression;Breast massage;Assist with latch;Adjust position  Audible Swallowing: None  Type of Nipple: Everted at rest and after stimulation  Comfort (Breast/Nipple): Soft / non-tender     Hold (Positioning): Assistance needed to correctly position infant at breast and maintain latch. Intervention(s): Breastfeeding basics reviewed;Support Pillows;Position options;Skin to skin  LATCH Score: 5  Lactation Tools Discussed/Used     Consult Status Consult Status: Follow-up Date: 10/12/15 Follow-up type: In-patient    Ave Filter 10/11/2015, 11:29 AM

## 2015-10-11 NOTE — Progress Notes (Signed)
Post Partum Day 1 Subjective: up ad lib, voiding and tolerating PO. Complaining of feeling sore and uncomfortable  Objective: Blood pressure 137/84, pulse 86, temperature 98.1 F (36.7 C), temperature source Oral, resp. rate 18, height 5\' 2"  (1.575 m), weight 94.348 kg (208 lb), last menstrual period 12/27/2014, SpO2 98 %, unknown if currently breastfeeding.  Physical Exam:  General: alert, cooperative and appears stated age 30: appropriate Uterine Fundus: firm   Recent Labs  10/09/15 0050 10/11/15 0539  HGB 13.7 10.0*  HCT 38.8 29.0*    Assessment/Plan: Plan for discharge tomorrow and Breastfeeding   LOS: 2 days   Jacqueline Davidson H. 10/11/2015, 8:08 PM

## 2015-10-11 NOTE — Discharge Summary (Signed)
Obstetric Discharge Summary Reason for Admission: induction of labor Prenatal Procedures: NST Intrapartum Procedures: spontaneous vaginal delivery Postpartum Procedures: none Complications-Operative and Postpartum: 4th degree perineal laceration HEMOGLOBIN  Date Value Ref Range Status  10/11/2015 10.0* 12.0 - 15.0 g/dL Final   HCT  Date Value Ref Range Status  10/11/2015 29.0* 36.0 - 46.0 % Final    Discharge Diagnoses: Term Pregnancy-delivered  Discharge Information: Date: 10/11/2015 Activity: pelvic rest Diet: routine Medications: Ibuprofen, Colace and Iron Condition: stable Instructions: refer to practice specific booklet Discharge to: home Follow-up Information    Follow up with Olga Millers, MD In 4 weeks.   Specialty:  Obstetrics and Gynecology   Contact information:   [place] [id number] [phone]       Newborn Data: Live born female  Birth Weight: 8 lb 9 oz (3884 g) APGAR: 1, 7  Home with mother.  Leauna Sharber A 10/11/2015, 9:59 PM

## 2015-10-12 MED ORDER — OXYCODONE-ACETAMINOPHEN 5-325 MG PO TABS: 1.0000 | ORAL_TABLET | ORAL | Status: DC | PRN

## 2015-10-12 NOTE — Progress Notes (Signed)
Patient is eating, ambulating, voiding.  Pain control is good.  Filed Vitals:   10/10/15 1834 10/11/15 0558 10/11/15 1747 10/12/15 0500  BP: 115/84 118/75 137/84 107/71  Pulse: 82 84 86 87  Temp: 98.2 F (36.8 C) 97.6 F (36.4 C) 98.1 F (36.7 C) 98.4 F (36.9 C)  TempSrc: Oral Oral Oral Oral  Resp: 18 18 18 18   Height:      Weight:      SpO2: 98%   98%    Fundus firm Perineum without swelling.  Lab Results  Component Value Date   WBC 11.5* 10/11/2015   HGB 10.0* 10/11/2015   HCT 29.0* 10/11/2015   MCV 89.2 10/11/2015   PLT 256 10/11/2015    --/--/O POS, O POS (05/09 0050)/RI  A/P Post partum day 2.  Routine care.  Expect d/c today.    Dariane Natzke A

## 2015-10-12 NOTE — Plan of Care (Signed)
Problem: Coping: Goal: Ability to cope will improve Outcome: Progressing NICU baby

## 2015-10-14 ENCOUNTER — Ambulatory Visit: Payer: Self-pay

## 2015-10-14 NOTE — Lactation Note (Signed)
This note was copied from a baby's chart. Lactation Consultation Note  Baby is in NICU and four days old.  Baby has been taking good volumes from bottle.  Mom's milk is coming in and she is pumping 30-40 mls.  Mom would like assist putting baby to breast.  Baby awake and showing feeding cues.  Assisted with positioning baby in football hold on right breast.  Baby opens wide and latches but slips off.  20 mm nipple shield applied and baby latched well and sustained latch.  Baby nursed actively with many audible swallows.  Breastfeeding basics reviewed and questions answered.  Lactation outpatient services and support reviewed and encouraged.  Patient Name: Girl Mahati Harvan [id number] Date: 10/14/2015 Reason for consult: Follow-up assessment;NICU baby   Maternal Data    Feeding Feeding Type: Breast Fed Nipple Type: Slow - flow Length of feed: 20 min  LATCH Score/Interventions Latch: Grasps breast easily, tongue down, lips flanged, rhythmical sucking. Intervention(s): Breast compression;Breast massage;Assist with latch;Adjust position  Audible Swallowing: Spontaneous and intermittent  Type of Nipple: Everted at rest and after stimulation  Comfort (Breast/Nipple): Soft / non-tender     Hold (Positioning): Assistance needed to correctly position infant at breast and maintain latch. Intervention(s): Breastfeeding basics reviewed;Support Pillows;Position options  LATCH Score: 9  Lactation Tools Discussed/Used     Consult Status      Ave Filter 10/14/2015, 4:03 PM

## 2015-11-07 ENCOUNTER — Other Ambulatory Visit: Payer: Self-pay | Admitting: Obstetrics and Gynecology

## 2015-11-12 LAB — CYTOLOGY - PAP

## 2017-09-01 DIAGNOSIS — Z319 Encounter for procreative management, unspecified: Secondary | ICD-10-CM | POA: Diagnosis not present

## 2017-11-05 DIAGNOSIS — N926 Irregular menstruation, unspecified: Secondary | ICD-10-CM | POA: Diagnosis not present

## 2017-11-16 DIAGNOSIS — Z3201 Encounter for pregnancy test, result positive: Secondary | ICD-10-CM | POA: Diagnosis not present

## 2017-11-18 DIAGNOSIS — N925 Other specified irregular menstruation: Secondary | ICD-10-CM | POA: Diagnosis not present

## 2017-12-09 DIAGNOSIS — O26849 Uterine size-date discrepancy, unspecified trimester: Secondary | ICD-10-CM | POA: Diagnosis not present

## 2017-12-09 DIAGNOSIS — Z369 Encounter for antenatal screening, unspecified: Secondary | ICD-10-CM | POA: Diagnosis not present

## 2017-12-09 DIAGNOSIS — Z3480 Encounter for supervision of other normal pregnancy, unspecified trimester: Secondary | ICD-10-CM | POA: Diagnosis not present

## 2017-12-09 DIAGNOSIS — Z113 Encounter for screening for infections with a predominantly sexual mode of transmission: Secondary | ICD-10-CM | POA: Diagnosis not present

## 2017-12-30 DIAGNOSIS — O3680X9 Pregnancy with inconclusive fetal viability, other fetus: Secondary | ICD-10-CM | POA: Diagnosis not present

## 2017-12-30 DIAGNOSIS — Z3480 Encounter for supervision of other normal pregnancy, unspecified trimester: Secondary | ICD-10-CM | POA: Diagnosis not present

## 2017-12-30 LAB — OB RESULTS CONSOLE HIV ANTIBODY (ROUTINE TESTING): HIV: NONREACTIVE

## 2017-12-30 LAB — OB RESULTS CONSOLE ANTIBODY SCREEN: ANTIBODY SCREEN: NEGATIVE

## 2017-12-30 LAB — OB RESULTS CONSOLE GC/CHLAMYDIA
CHLAMYDIA, DNA PROBE: NEGATIVE
Gonorrhea: NEGATIVE

## 2017-12-30 LAB — OB RESULTS CONSOLE RUBELLA ANTIBODY, IGM: RUBELLA: IMMUNE

## 2017-12-30 LAB — OB RESULTS CONSOLE RPR: RPR: NONREACTIVE

## 2017-12-30 LAB — OB RESULTS CONSOLE HEPATITIS B SURFACE ANTIGEN: HEP B S AG: NEGATIVE

## 2018-01-26 DIAGNOSIS — Z369 Encounter for antenatal screening, unspecified: Secondary | ICD-10-CM | POA: Diagnosis not present

## 2018-02-23 DIAGNOSIS — Z369 Encounter for antenatal screening, unspecified: Secondary | ICD-10-CM | POA: Diagnosis not present

## 2018-02-23 DIAGNOSIS — Z3482 Encounter for supervision of other normal pregnancy, second trimester: Secondary | ICD-10-CM | POA: Diagnosis not present

## 2018-03-23 DIAGNOSIS — O99212 Obesity complicating pregnancy, second trimester: Secondary | ICD-10-CM | POA: Diagnosis not present

## 2018-04-15 DIAGNOSIS — Z713 Dietary counseling and surveillance: Secondary | ICD-10-CM | POA: Diagnosis not present

## 2018-04-20 DIAGNOSIS — Z369 Encounter for antenatal screening, unspecified: Secondary | ICD-10-CM | POA: Diagnosis not present

## 2018-05-10 DIAGNOSIS — Z3480 Encounter for supervision of other normal pregnancy, unspecified trimester: Secondary | ICD-10-CM | POA: Diagnosis not present

## 2018-05-10 DIAGNOSIS — Z23 Encounter for immunization: Secondary | ICD-10-CM | POA: Diagnosis not present

## 2018-05-13 DIAGNOSIS — O99810 Abnormal glucose complicating pregnancy: Secondary | ICD-10-CM | POA: Diagnosis not present

## 2018-05-27 DIAGNOSIS — Z369 Encounter for antenatal screening, unspecified: Secondary | ICD-10-CM | POA: Diagnosis not present

## 2018-06-09 DIAGNOSIS — J029 Acute pharyngitis, unspecified: Secondary | ICD-10-CM | POA: Diagnosis not present

## 2018-06-09 DIAGNOSIS — J111 Influenza due to unidentified influenza virus with other respiratory manifestations: Secondary | ICD-10-CM | POA: Diagnosis not present

## 2018-06-17 DIAGNOSIS — Z369 Encounter for antenatal screening, unspecified: Secondary | ICD-10-CM | POA: Diagnosis not present

## 2018-07-02 DIAGNOSIS — O139 Gestational [pregnancy-induced] hypertension without significant proteinuria, unspecified trimester: Secondary | ICD-10-CM | POA: Diagnosis not present

## 2018-07-02 DIAGNOSIS — Z3480 Encounter for supervision of other normal pregnancy, unspecified trimester: Secondary | ICD-10-CM | POA: Diagnosis not present

## 2018-07-02 DIAGNOSIS — Z369 Encounter for antenatal screening, unspecified: Secondary | ICD-10-CM | POA: Diagnosis not present

## 2018-07-02 LAB — OB RESULTS CONSOLE GBS: GBS: POSITIVE

## 2018-07-05 DIAGNOSIS — O26843 Uterine size-date discrepancy, third trimester: Secondary | ICD-10-CM | POA: Diagnosis not present

## 2018-07-10 ENCOUNTER — Inpatient Hospital Stay (HOSPITAL_COMMUNITY)
Admission: AD | Admit: 2018-07-10 | Payer: BLUE CROSS/BLUE SHIELD | Source: Home / Self Care | Admitting: Obstetrics and Gynecology

## 2018-07-14 DIAGNOSIS — Z369 Encounter for antenatal screening, unspecified: Secondary | ICD-10-CM | POA: Diagnosis not present

## 2018-07-21 DIAGNOSIS — Z369 Encounter for antenatal screening, unspecified: Secondary | ICD-10-CM | POA: Diagnosis not present

## 2018-07-26 ENCOUNTER — Encounter (HOSPITAL_COMMUNITY): Payer: Self-pay | Admitting: *Deleted

## 2018-07-26 ENCOUNTER — Telehealth (HOSPITAL_COMMUNITY): Payer: Self-pay | Admitting: *Deleted

## 2018-07-26 DIAGNOSIS — Z369 Encounter for antenatal screening, unspecified: Secondary | ICD-10-CM | POA: Diagnosis not present

## 2018-07-26 NOTE — Telephone Encounter (Signed)
Preadmission screen  

## 2018-07-27 ENCOUNTER — Inpatient Hospital Stay (HOSPITAL_COMMUNITY): Payer: BLUE CROSS/BLUE SHIELD | Admitting: Anesthesiology

## 2018-07-27 ENCOUNTER — Encounter (HOSPITAL_COMMUNITY)
Admission: AD | Disposition: A | Discharge status: Home / Self Care | Payer: Self-pay | Source: Home / Self Care | Attending: Obstetrics and Gynecology

## 2018-07-27 ENCOUNTER — Inpatient Hospital Stay (HOSPITAL_COMMUNITY)
Admission: AD | Admit: 2018-07-27 | Discharge: 2018-07-29 | DRG: 788 | Disposition: A | Discharge status: Home / Self Care | Payer: BLUE CROSS/BLUE SHIELD | Attending: Obstetrics and Gynecology | Admitting: Obstetrics and Gynecology

## 2018-07-27 ENCOUNTER — Encounter (HOSPITAL_COMMUNITY): Payer: Self-pay | Admitting: *Deleted

## 2018-07-27 DIAGNOSIS — O328XX0 Maternal care for other malpresentation of fetus, not applicable or unspecified: Secondary | ICD-10-CM | POA: Diagnosis present

## 2018-07-27 DIAGNOSIS — Z3A39 39 weeks gestation of pregnancy: Secondary | ICD-10-CM | POA: Diagnosis not present

## 2018-07-27 DIAGNOSIS — O26893 Other specified pregnancy related conditions, third trimester: Secondary | ICD-10-CM | POA: Diagnosis not present

## 2018-07-27 DIAGNOSIS — D252 Subserosal leiomyoma of uterus: Secondary | ICD-10-CM | POA: Diagnosis not present

## 2018-07-27 DIAGNOSIS — O3413 Maternal care for benign tumor of corpus uteri, third trimester: Secondary | ICD-10-CM | POA: Diagnosis not present

## 2018-07-27 DIAGNOSIS — O3663X0 Maternal care for excessive fetal growth, third trimester, not applicable or unspecified: Secondary | ICD-10-CM | POA: Diagnosis not present

## 2018-07-27 DIAGNOSIS — O99824 Streptococcus B carrier state complicating childbirth: Secondary | ICD-10-CM | POA: Diagnosis present

## 2018-07-27 DIAGNOSIS — O99892 Other specified diseases and conditions complicating childbirth: Secondary | ICD-10-CM

## 2018-07-27 DIAGNOSIS — O321XX1 Maternal care for breech presentation, fetus 1: Secondary | ICD-10-CM | POA: Diagnosis not present

## 2018-07-27 SURGERY — Surgical Case
Anesthesia: Regional | Wound class: Clean Contaminated

## 2018-07-27 MED ORDER — FENTANYL CITRATE (PF) 250 MCG/5ML IJ SOLN
INTRAMUSCULAR | Status: AC
  Filled 2018-07-27: qty 5

## 2018-07-27 MED ORDER — DIPHENHYDRAMINE HCL 25 MG PO CAPS: 25.0000 mg | ORAL_CAPSULE | Freq: Four times a day (QID) | ORAL | Status: DC | PRN

## 2018-07-27 MED ORDER — ONDANSETRON HCL 4 MG/2ML IJ SOLN
INTRAMUSCULAR | Status: AC
  Filled 2018-07-27: qty 2

## 2018-07-27 MED ORDER — LACTATED RINGERS IV SOLN
INTRAVENOUS | Status: DC
  Administered 2018-07-27 – 2018-07-28 (×2): via INTRAVENOUS

## 2018-07-27 MED ORDER — HYDROMORPHONE HCL 1 MG/ML IJ SOLN: 0.2000 mg | INTRAMUSCULAR | Status: DC | PRN

## 2018-07-27 MED ORDER — DIBUCAINE 1 % RE OINT: 1.0000 "application " | TOPICAL_OINTMENT | RECTAL | Status: DC | PRN

## 2018-07-27 MED ORDER — MIDAZOLAM HCL 2 MG/2ML IJ SOLN
INTRAMUSCULAR | Status: DC | PRN
  Administered 2018-07-27: 2 mg via INTRAVENOUS

## 2018-07-27 MED ORDER — METHYLERGONOVINE MALEATE 0.2 MG PO TABS: 0.2000 mg | ORAL_TABLET | ORAL | Status: DC | PRN

## 2018-07-27 MED ORDER — ONDANSETRON HCL 4 MG/2ML IJ SOLN
INTRAMUSCULAR | Status: DC | PRN
  Administered 2018-07-27: 4 mg via INTRAVENOUS

## 2018-07-27 MED ORDER — DIPHENHYDRAMINE HCL 50 MG/ML IJ SOLN: 12.5000 mg | Freq: Four times a day (QID) | INTRAMUSCULAR | Status: DC | PRN

## 2018-07-27 MED ORDER — CEFAZOLIN SODIUM-DEXTROSE 2-3 GM-%(50ML) IV SOLR
INTRAVENOUS | Status: DC | PRN
  Administered 2018-07-27: 2 g via INTRAVENOUS

## 2018-07-27 MED ORDER — SUCCINYLCHOLINE CHLORIDE 200 MG/10ML IV SOSY
PREFILLED_SYRINGE | INTRAVENOUS | Status: DC | PRN
  Administered 2018-07-27: 120 mg via INTRAVENOUS

## 2018-07-27 MED ORDER — METHYLERGONOVINE MALEATE 0.2 MG/ML IJ SOLN: 0.2000 mg | INTRAMUSCULAR | Status: DC | PRN

## 2018-07-27 MED ORDER — SIMETHICONE 80 MG PO CHEW
80.0000 mg | CHEWABLE_TABLET | ORAL | Status: DC
  Administered 2018-07-27 – 2018-07-29 (×2): 80 mg via ORAL
  Filled 2018-07-27 (×2): qty 1

## 2018-07-27 MED ORDER — HYDROMORPHONE 1 MG/ML IV SOLN
INTRAVENOUS | Status: DC
  Administered 2018-07-27: 1.1 mg via INTRAVENOUS
  Administered 2018-07-27: 0.3 mg via INTRAVENOUS
  Administered 2018-07-27: 14:00:00 via INTRAVENOUS
  Administered 2018-07-28: 0 mg via INTRAVENOUS
  Administered 2018-07-28: 0.3 mg via INTRAVENOUS
  Administered 2018-07-28: 0.386 mg via INTRAVENOUS
  Filled 2018-07-27: qty 25

## 2018-07-27 MED ORDER — PROPOFOL 10 MG/ML IV BOLUS
INTRAVENOUS | Status: DC | PRN
  Administered 2018-07-27: 200 mg via INTRAVENOUS

## 2018-07-27 MED ORDER — DEXAMETHASONE SODIUM PHOSPHATE 10 MG/ML IJ SOLN
INTRAMUSCULAR | Status: DC | PRN
  Administered 2018-07-27: 10 mg via INTRAVENOUS

## 2018-07-27 MED ORDER — ONDANSETRON HCL 4 MG/2ML IJ SOLN: 4.0000 mg | Freq: Four times a day (QID) | INTRAMUSCULAR | Status: DC | PRN

## 2018-07-27 MED ORDER — FENTANYL CITRATE (PF) 100 MCG/2ML IJ SOLN
INTRAMUSCULAR | Status: AC
  Filled 2018-07-27: qty 2

## 2018-07-27 MED ORDER — NALOXONE HCL 0.4 MG/ML IJ SOLN: 0.4000 mg | INTRAMUSCULAR | Status: DC | PRN

## 2018-07-27 MED ORDER — OXYTOCIN 10 UNIT/ML IJ SOLN
INTRAMUSCULAR | Status: DC | PRN
  Administered 2018-07-27: 40 [IU] via INTRAVENOUS

## 2018-07-27 MED ORDER — ACETAMINOPHEN 500 MG PO TABS
1000.0000 mg | ORAL_TABLET | Freq: Four times a day (QID) | ORAL | Status: DC
  Administered 2018-07-27 – 2018-07-29 (×7): 1000 mg via ORAL
  Filled 2018-07-27 (×8): qty 2

## 2018-07-27 MED ORDER — OXYCODONE HCL 5 MG/5ML PO SOLN: 5.0000 mg | Freq: Once | ORAL | Status: DC | PRN

## 2018-07-27 MED ORDER — FENTANYL CITRATE (PF) 100 MCG/2ML IJ SOLN
25.0000 ug | INTRAMUSCULAR | Status: DC | PRN
  Administered 2018-07-27 (×2): 50 ug via INTRAVENOUS

## 2018-07-27 MED ORDER — SIMETHICONE 80 MG PO CHEW
80.0000 mg | CHEWABLE_TABLET | Freq: Three times a day (TID) | ORAL | Status: DC
  Administered 2018-07-27 – 2018-07-29 (×5): 80 mg via ORAL
  Filled 2018-07-27 (×5): qty 1

## 2018-07-27 MED ORDER — METHYLERGONOVINE MALEATE 0.2 MG PO TABS
ORAL_TABLET | ORAL | Status: AC
  Filled 2018-07-27: qty 1

## 2018-07-27 MED ORDER — COCONUT OIL OIL: 1.0000 "application " | TOPICAL_OIL | Status: DC | PRN

## 2018-07-27 MED ORDER — OXYTOCIN 40 UNITS IN NORMAL SALINE INFUSION - SIMPLE MED: 2.5000 [IU]/h | INTRAVENOUS | Status: AC

## 2018-07-27 MED ORDER — METHYLERGONOVINE MALEATE 0.2 MG PO TABS
0.2000 mg | ORAL_TABLET | ORAL | Status: DC | PRN
  Administered 2018-07-27: 0.2 mg via ORAL

## 2018-07-27 MED ORDER — OXYCODONE HCL 5 MG PO TABS: 5.0000 mg | ORAL_TABLET | Freq: Once | ORAL | Status: DC | PRN

## 2018-07-27 MED ORDER — LACTATED RINGERS IV SOLN
INTRAVENOUS | Status: DC | PRN
  Administered 2018-07-27: 10:00:00 via INTRAVENOUS

## 2018-07-27 MED ORDER — MENTHOL 3 MG MT LOZG: 1.0000 | LOZENGE | OROMUCOSAL | Status: DC | PRN

## 2018-07-27 MED ORDER — SENNOSIDES-DOCUSATE SODIUM 8.6-50 MG PO TABS
2.0000 | ORAL_TABLET | ORAL | Status: DC
  Administered 2018-07-27 – 2018-07-29 (×2): 2 via ORAL
  Filled 2018-07-27 (×2): qty 2

## 2018-07-27 MED ORDER — DEXAMETHASONE SODIUM PHOSPHATE 10 MG/ML IJ SOLN
INTRAMUSCULAR | Status: AC
  Filled 2018-07-27: qty 1

## 2018-07-27 MED ORDER — ONDANSETRON HCL 4 MG/2ML IJ SOLN: 4.0000 mg | Freq: Once | INTRAMUSCULAR | Status: DC | PRN

## 2018-07-27 MED ORDER — FENTANYL CITRATE (PF) 100 MCG/2ML IJ SOLN
50.0000 ug | Freq: Once | INTRAMUSCULAR | Status: AC
  Administered 2018-07-27: 50 ug via INTRAVENOUS

## 2018-07-27 MED ORDER — ZOLPIDEM TARTRATE 5 MG PO TABS: 5.0000 mg | ORAL_TABLET | Freq: Every evening | ORAL | Status: DC | PRN

## 2018-07-27 MED ORDER — SIMETHICONE 80 MG PO CHEW: 80.0000 mg | CHEWABLE_TABLET | ORAL | Status: DC | PRN

## 2018-07-27 MED ORDER — DIPHENHYDRAMINE HCL 12.5 MG/5ML PO ELIX
12.5000 mg | ORAL_SOLUTION | Freq: Four times a day (QID) | ORAL | Status: DC | PRN
  Filled 2018-07-27: qty 5

## 2018-07-27 MED ORDER — SODIUM CHLORIDE 0.9% FLUSH: 9.0000 mL | INTRAVENOUS | Status: DC | PRN

## 2018-07-27 MED ORDER — PROPOFOL 10 MG/ML IV BOLUS
INTRAVENOUS | Status: AC
  Filled 2018-07-27: qty 20

## 2018-07-27 MED ORDER — MIDAZOLAM HCL 2 MG/2ML IJ SOLN
INTRAMUSCULAR | Status: AC
  Filled 2018-07-27: qty 2

## 2018-07-27 MED ORDER — TETANUS-DIPHTH-ACELL PERTUSSIS 5-2.5-18.5 LF-MCG/0.5 IM SUSP: 0.5000 mL | Freq: Once | INTRAMUSCULAR | Status: DC

## 2018-07-27 MED ORDER — FENTANYL CITRATE (PF) 100 MCG/2ML IJ SOLN
INTRAMUSCULAR | Status: DC | PRN
  Administered 2018-07-27: 50 ug via INTRAVENOUS
  Administered 2018-07-27 (×2): 100 ug via INTRAVENOUS

## 2018-07-27 MED ORDER — WITCH HAZEL-GLYCERIN EX PADS: 1.0000 "application " | MEDICATED_PAD | CUTANEOUS | Status: DC | PRN

## 2018-07-27 MED ORDER — OXYCODONE HCL 5 MG PO TABS
5.0000 mg | ORAL_TABLET | ORAL | Status: DC | PRN
  Administered 2018-07-28 – 2018-07-29 (×3): 5 mg via ORAL
  Filled 2018-07-27 (×3): qty 1

## 2018-07-27 MED ORDER — PRENATAL MULTIVITAMIN CH
1.0000 | ORAL_TABLET | Freq: Every day | ORAL | Status: DC
  Administered 2018-07-28 – 2018-07-29 (×2): 1 via ORAL
  Filled 2018-07-27 (×2): qty 1

## 2018-07-27 MED ORDER — CEFAZOLIN SODIUM-DEXTROSE 2-4 GM/100ML-% IV SOLN
2.0000 g | Freq: Three times a day (TID) | INTRAVENOUS | Status: AC
  Administered 2018-07-27 – 2018-07-28 (×3): 2 g via INTRAVENOUS
  Filled 2018-07-27 (×4): qty 100

## 2018-07-27 SURGICAL SUPPLY — 44 items
BENZOIN TINCTURE PRP APPL 2/3 (GAUZE/BANDAGES/DRESSINGS) ×3 IMPLANT
CHLORAPREP W/TINT 26ML (MISCELLANEOUS) ×3 IMPLANT
CLAMP CORD UMBIL (MISCELLANEOUS) IMPLANT
CLOSURE WOUND 1/2 X4 (GAUZE/BANDAGES/DRESSINGS) ×1
CLOTH BEACON ORANGE TIMEOUT ST (SAFETY) ×3 IMPLANT
DERMABOND ADHESIVE PROPEN (GAUZE/BANDAGES/DRESSINGS) ×2
DERMABOND ADVANCED .7 DNX6 (GAUZE/BANDAGES/DRESSINGS) IMPLANT
DRSG OPSITE POSTOP 4X10 (GAUZE/BANDAGES/DRESSINGS) ×3 IMPLANT
ELECT REM PT RETURN 9FT ADLT (ELECTROSURGICAL) ×3
ELECTRODE REM PT RTRN 9FT ADLT (ELECTROSURGICAL) ×1 IMPLANT
EXTRACTOR VACUUM M CUP 4 TUBE (SUCTIONS) IMPLANT
EXTRACTOR VACUUM M CUP 4' TUBE (SUCTIONS)
GLOVE BIOGEL PI IND STRL 7.0 (GLOVE) ×2 IMPLANT
GLOVE BIOGEL PI IND STRL 7.5 (GLOVE) ×2 IMPLANT
GLOVE BIOGEL PI INDICATOR 7.0 (GLOVE) ×4
GLOVE BIOGEL PI INDICATOR 7.5 (GLOVE) ×4
GLOVE ECLIPSE 7.5 STRL STRAW (GLOVE) ×3 IMPLANT
GOWN STRL REUS W/TWL LRG LVL3 (GOWN DISPOSABLE) ×9 IMPLANT
KIT ABG SYR 3ML LUER SLIP (SYRINGE) IMPLANT
NDL HYPO 25X5/8 SAFETYGLIDE (NEEDLE) IMPLANT
NEEDLE HYPO 25X5/8 SAFETYGLIDE (NEEDLE) IMPLANT
NS IRRIG 1000ML POUR BTL (IV SOLUTION) ×3 IMPLANT
PACK C SECTION WH (CUSTOM PROCEDURE TRAY) ×3 IMPLANT
PAD OB MATERNITY 4.3X12.25 (PERSONAL CARE ITEMS) ×3 IMPLANT
PENCIL SMOKE EVAC W/HOLSTER (ELECTROSURGICAL) ×3 IMPLANT
RTRCTR C-SECT PINK 25CM LRG (MISCELLANEOUS) ×3 IMPLANT
STRIP CLOSURE SKIN 1/2X4 (GAUZE/BANDAGES/DRESSINGS) ×2 IMPLANT
SUT CHROMIC 1 CTX 36 (SUTURE) ×4 IMPLANT
SUT CHROMIC 2 0 CT 1 (SUTURE) ×2 IMPLANT
SUT PDS AB 0 CT1 27 (SUTURE) ×2 IMPLANT
SUT PLAIN 2 0 XLH (SUTURE) ×2 IMPLANT
SUT VIC AB 0 CT1 27 (SUTURE) ×4
SUT VIC AB 0 CT1 27XBRD ANBCTR (SUTURE) IMPLANT
SUT VIC AB 0 CTX 36 (SUTURE) ×6
SUT VIC AB 0 CTX36XBRD ANBCTRL (SUTURE) ×3 IMPLANT
SUT VIC AB 2-0 CT1 27 (SUTURE) ×2
SUT VIC AB 2-0 CT1 TAPERPNT 27 (SUTURE) ×1 IMPLANT
SUT VIC AB 2-0 CTX 36 (SUTURE) ×4 IMPLANT
SUT VIC AB 2-0 SH 27 (SUTURE) ×2
SUT VIC AB 2-0 SH 27XBRD (SUTURE) IMPLANT
SUT VIC AB 4-0 KS 27 (SUTURE) ×5 IMPLANT
TOWEL OR 17X24 6PK STRL BLUE (TOWEL DISPOSABLE) ×3 IMPLANT
TRAY FOLEY W/BAG SLVR 14FR LF (SET/KITS/TRAYS/PACK) ×3 IMPLANT
WATER STERILE IRR 1000ML POUR (IV SOLUTION) ×3 IMPLANT

## 2018-07-27 NOTE — Brief Op Note (Signed)
07/27/2018  11:19 AM  PATIENT:  Jacqueline Davidson  33 y.o. female  PRE-OPERATIVE DIAGNOSIS:   primary ceserean; prolapsed cord; malpresentation  POST-OPERATIVE DIAGNOSIS:   primary ceserean; prolapsed cord; malpresentation  PROCEDURE:  Procedure(s): CESAREAN SECTION (N/A)  SURGEON:  Surgeon(s) and Role:    * Truett Mainland, DO - Primary    * Nicolette Bang, DO - Assisting    * Vanessa Kick, MD  PHYSICIAN ASSISTANT:   ASSISTANTS: none   ANESTHESIA:   general  EBL:  748 mL   BLOOD ADMINISTERED:none  DRAINS: Urinary Catheter (Foley)   LOCAL MEDICATIONS USED:  NONE  SPECIMEN:  No Specimen  DISPOSITION OF SPECIMEN:  placenta for disposal  COUNTS:  YES  TOURNIQUET:  * No tourniquets in log *  DICTATION: .Dragon Dictation  PLAN OF CARE: Admit to inpatient   PATIENT DISPOSITION:  PACU - hemodynamically stable.   Delay start of Pharmacological VTE agent (>24hrs) due to surgical blood loss or risk of bleeding: not applicable

## 2018-07-27 NOTE — Progress Notes (Signed)
Patient ID: Jacqueline Davidson, female   DOB: [birth year], 33 y.o.   MRN: [id number] CTBS emergently at 1007 for cord prolapse. Pt was term Therapist, sports labor check. When RN performed VE she reported feeling accidental ROM and cord prolapse. Felt probable footing breech presentation. FHR 125. Called Faculty Attending for STAT C/S. Dr. Nehemiah Settle at [facility] in < 1 min. Transported to OR at ~1012 w/ RN supporting presenting part. Called Dr. Harrington Challenger who states she is 20 away. Dr. Nehemiah Settle. Starting C/S.    Tamala Julian, [place], [place] 07/27/2018 11:48 AM

## 2018-07-27 NOTE — Progress Notes (Signed)
Patient ID: Jacqueline Davidson, female   DOB: Aug 22, 1985, 33 y.o.   MRN: [id number]  Late documentation. Called to MAU emergently for cord prolapse. On my arrival, heart tones bradycardic. Patient verbally consented for emergent cesarean delivery:  The risks of cesarean section discussed with the patient included but were not limited to: bleeding which may require transfusion or reoperation; infection which may require antibiotics; injury to bowel, bladder, ureters or other surrounding organs; injury to the fetus; need for additional procedures including hysterectomy in the event of a life-threatening hemorrhage; placental abnormalities wth subsequent pregnancies, incisional problems, thromboembolic phenomenon and other postoperative/anesthesia complications. The patient concurred with the proposed plan, giving informed written consent for the procedure.      Truett Mainland, DO 07/27/2018 11:41 AM

## 2018-07-27 NOTE — Anesthesia Postprocedure Evaluation (Signed)
Anesthesia Post Note  Patient: Jacqueline Davidson  Procedure(s) Performed: CESAREAN SECTION (N/A )     Patient location during evaluation: Mother Baby Anesthesia Type: General Level of consciousness: awake and alert Pain management: satisfactory to patient Vital Signs Assessment: post-procedure vital signs reviewed and stable Respiratory status: spontaneous breathing and respiratory function stable Cardiovascular status: stable Postop Assessment: adequate PO intake Anesthetic complications: no    Last Vitals:  Vitals:   07/27/18 1635 07/27/18 1800  BP: (!) 133/92   Pulse: 81   Resp: 18 12  Temp: 37.4 C   SpO2: 97% 97%    Last Pain:  Vitals:   07/27/18 1800  TempSrc:   PainSc: 4    Pain Goal: Patients Stated Pain Goal: 3 (07/27/18 1635)                 [name]

## 2018-07-27 NOTE — Progress Notes (Signed)
This note also relates to the following rows which could not be included: BP - Cannot attach notes to unvalidated device data Pulse Rate - Cannot attach notes to unvalidated device data ECG Heart Rate - Cannot attach notes to unvalidated device data Resp - Cannot attach notes to unvalidated device data SpO2 - Cannot attach notes to unvalidated device data  Speculum exam at bedside

## 2018-07-27 NOTE — Anesthesia Procedure Notes (Signed)
Procedure Name: Intubation Date/Time: 07/27/2018 10:14 AM Performed by: Lidia Collum, MD Pre-anesthesia Checklist: Patient identified, Emergency Drugs available, Suction available and Patient being monitored Patient Re-evaluated:Patient Re-evaluated prior to induction Oxygen Delivery Method: Circle system utilized Preoxygenation: Pre-oxygenation with 100% oxygen Induction Type: IV induction, Rapid sequence and Cricoid Pressure applied Laryngoscope Size: Glidescope and 3 Grade View: Grade I Tube type: Oral Tube size: 7.0 mm Number of attempts: 1 Airway Equipment and Method: Video-laryngoscopy and Stylet Placement Confirmation: ETT inserted through vocal cords under direct vision,  positive ETCO2 and breath sounds checked- equal and bilateral Secured at: 21 cm Tube secured with: Tape

## 2018-07-27 NOTE — Transfer of Care (Signed)
Immediate Anesthesia Transfer of Care Note  Patient: Jacqueline Davidson  Procedure(s) Performed: CESAREAN SECTION (N/A )  Patient Location: PACU  Anesthesia Type:General  Level of Consciousness: awake, alert  and oriented  Airway & Oxygen Therapy: Patient Spontanous Breathing and Patient connected to nasal cannula oxygen  Post-op Assessment: Report given to RN, Post -op Vital signs reviewed and stable and Patient moving all extremities X 4  Post vital signs: Reviewed and stable  Last Vitals:  Vitals Value Taken Time  BP 134/97 07/27/2018 11:27 AM  Temp    Pulse 103 07/27/2018 11:30 AM  Resp 18 07/27/2018 11:30 AM  SpO2 98 % 07/27/2018 11:30 AM  Vitals shown include unvalidated device data.  Last Pain: There were no vitals filed for this visit.       Complications: No apparent anesthesia complications

## 2018-07-27 NOTE — Anesthesia Postprocedure Evaluation (Signed)
Anesthesia Post Note  Patient: Jacqueline Davidson  Procedure(s) Performed: CESAREAN SECTION (N/A )     Patient location during evaluation: PACU Anesthesia Type: General Level of consciousness: awake and alert Pain management: pain level controlled Vital Signs Assessment: post-procedure vital signs reviewed and stable Respiratory status: spontaneous breathing, nonlabored ventilation and respiratory function stable Cardiovascular status: blood pressure returned to baseline and stable Postop Assessment: no apparent nausea or vomiting Anesthetic complications: no    Last Vitals:  Vitals:   07/27/18 1245 07/27/18 1300  BP: 134/88 (!) 132/100  Pulse: 77 76  Resp: 15 13  Temp:    SpO2: 96% 98%    Last Pain:  Vitals:   07/27/18 1215  TempSrc:   PainSc: 6    Pain Goal:    LLE Motor Response: Purposeful movement (07/27/18 1245)   RLE Motor Response: Purposeful movement (07/27/18 1245)       Epidural/Spinal Function Cutaneous sensation: Normal sensation (07/27/18 1245), Patient able to flex knees: Yes (07/27/18 1245), Patient able to lift hips off bed: Yes (07/27/18 1245), Back pain beyond tenderness at insertion site: No (07/27/18 1245), Progressively worsening motor and/or sensory loss: No (07/27/18 1245), Bowel and/or bladder incontinence post epidural: No (07/27/18 1245)  Lidia Collum

## 2018-07-27 NOTE — Anesthesia Preprocedure Evaluation (Addendum)
Anesthesia Evaluation   Patient awake    Reviewed: Allergy & PrecautionsPreop documentation limited or incomplete due to emergent nature of procedure.  History of Anesthesia Complications Negative for: history of anesthetic complications  Airway Mallampati: II  TM Distance: >3 FB Neck ROM: full    Dental no notable dental hx.    Pulmonary    Pulmonary exam normal        Cardiovascular  Rhythm:regular Rate:Tachycardia     Neuro/Psych    GI/Hepatic   Endo/Other    Renal/GU      Musculoskeletal   Abdominal   Peds  Hematology   Anesthesia Other Findings Pt to OR STAT for C/S with cord prolapse  Reproductive/Obstetrics (+) Pregnancy                             Anesthesia Physical Anesthesia Plan  ASA: II and emergent  Anesthesia Plan: General ETT   Post-op Pain Management:    Induction: Intravenous, Cricoid pressure planned and Rapid sequence  PONV Risk Score and Plan: 4 or greater and Ondansetron, Treatment may vary due to age or medical condition, Dexamethasone and Midazolam  Airway Management Planned: Oral ETT  Additional Equipment: None  Intra-op Plan:   Post-operative Plan: Extubation in OR  Informed Consent:     Only emergency history available  Plan Discussed with:   Anesthesia Plan Comments:        Anesthesia Quick Evaluation

## 2018-07-27 NOTE — MAU Note (Signed)
Pt presents with contractions, SVE complete with SROM large amount of clear fluid, prolapse cord with feet presenting.

## 2018-07-27 NOTE — H&P (Addendum)
Jacqueline Davidson is a 33 y.o. female presenting for contractions  33 year old gravida 3 para 1-0-1-1 at 39+4 presents to maternity admissions.  A cervical exam was performed and maternity admissions.  At the time of exam's spontaneous rupture of membranes occurred with subsequent cord prolapse.  Additionally, malpresentation was identified.  The teaching service attending, Dr. Loma Boston was called to evaluate.  An emergency cesarean section was called and the patient was immediately transferred to the operating room.  Please see the dictated operative report for additional details of the surgery  The patient had been seen for routine prenatal visit on February 24.  Cervical exam at that time was 2 to 3 cm dilated.  Cephalic presentation was confirmed by digital exam.  Estimated fetal weight at 36 weeks was 8 pounds 5 ounces with an AFI of 22.  Patient has a history of a spontaneous vaginal delivery for an 8 pound 9 ounce baby with her first pregnancy without difficulty.   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  She conceived with metformin and clomiphene citrate OB History    Gravida  2   Para  2   Term  2   Preterm      AB      Living  2     SAB      TAB      Ectopic      Multiple  0   Live Births  2          Past Medical History:  Diagnosis Date  . Hx of varicella   . Left breast mass    Past Surgical History:  Procedure Laterality Date  . left breast lumpectomy  06/03/2011   no evidence of malignancy  . TYMPANOSTOMY TUBE PLACEMENT  33 years old   Family History: family history includes Diabetes in her maternal grandmother and paternal grandfather; Esophageal cancer in her father. Social History:  reports that she has never smoked. She has never used smokeless tobacco. She reports that she does not drink alcohol or use drugs.     Maternal Diabetes: No, abnormal 1hr gtt, passed 3 hr gtt Genetic Screening: Declined, however AFP performed  and WNL Maternal Ultrasounds/Referrals: Normal Fetal Ultrasounds or other Referrals:  None Maternal Substance Abuse:  No Significant Maternal Medications:  None Significant Maternal Lab Results:  None Other Comments:  None  ROS History Dilation: 10 Effacement (%): 100 Exam by:: ginger morris RN Temperature 98.1 F (36.7 C), temperature source Oral, last menstrual period 10/16/2017, unknown if currently breastfeeding. Exam Physical Exam  Prenatal labs: ABO, Rh:   Antibody: Negative (07/31 0000) Rubella: Immune (07/31 0000) RPR: Nonreactive (07/31 0000)  HBsAg: Negative (07/31 0000)  HIV: Non-reactive (07/31 0000)  GBS: Positive (01/31 0000)   Assessment/Plan: 1) Admit 2) Stat section  3) Due to lack of surgical antibiotic prophylaxis will treat with ancef 1 gm Q8 x 3 doses  Vanessa Kick 07/27/2018, 11:36 AM

## 2018-07-27 NOTE — Addendum Note (Signed)
Addendum  created 07/27/18 1820 by Flossie Dibble, CRNA   Clinical Note Signed

## 2018-07-27 NOTE — Op Note (Signed)
Pre-Operative Diagnosis: 1) 39+4-week intrauterine pregnancy 2) cord prolapse 3) double footling breech Postoperative Diagnosis: 1) 39+4-week intrauterine pregnancy 2) cord prolapse 3) double footling breech 4) macrosomia  Procedure: Emergent primary low transverse cesarean section Surgeon: Dr. Vanessa Kick Surgeon: Dr. Loma Boston Operative Findings: Female infant in the double footling breech presentation with Apgar scores of 4 at 1 minute and 9 at 5 minutes.  4 cm anterior subserosal fibroid. Specimen: Placenta EBL: Total I/O In: 1300 [I.V.:1300] Out: 798 [Urine:50; Blood:748] Triton reports EBL at 748, clinically EBL at least 1000 cc   Procedure:Jacqueline Davidson is an 33 year old gravida 3 para 1011 at 18 weeks and 4 days estimated gestational age who presents for cesarean section.  The patient presented to maternity admissions.  In maternity admissions a cervical exam was performed.  It is reported that during the cervical exam spontaneous rupture of membranes occurred with subsequent cord prolapse.  Additionally, small parts were palpated consistent with non-vertex presentation.  Dr. Nehemiah Settle, the teaching service attending in-house, was called immediately to MAU for evaluation.  Fetal heart tones were obtained at 125.  A stat section was immediately called.  I was at my office when the stat section was called and immediately proceeded to the hospital.  The patient was transferred to the operating room and verbally consented.  Following the appropriate informed consent the patient was brought to the operating room where general anesthesia was administered and found to be adequate. She was placed in the dorsal supine position with a leftward tilt. She was prepped and draped in the normal sterile fashion. Scalpel was then used to make a Pfannenstiel skin incision which was carried down to the underlying layers of soft tissue to the fascia. The fascia was incised in the midline and the fascial incision was  extended laterally with Mayo scissors. The superior aspect of the fascial incision was grasped with Coker clamps x2, tented up and the rectus muscles dissected off sharply the same procedure was repeated on the inferior aspect of the fascial incision. The rectus muscles were separated in the midline. The abdominal peritoneum was identified, tented up, entered sharply, and the incision was extended superiorly and inferiorly with good visualization of the bladder. The bladder blade was inserted. The Scalpel was then used to make a mid-transverse incision on the uterus which was extended laterally with both blunt dissection and the bandage scissors.  The infant was delivered through the uterine incision in the double footling breech presentation. The cord was clamped and cut and the infant was passed to the waiting neonatologist. Placenta was then delivered spontaneously, the uterus was cleared of all clot and debris.  At this time I arrived. The uterine incision was repaired in 3 layers with #1 vicryl in running locked fashion followed by a second running locked layer with #1 chromic. A final imbricating layer with #1 chromic was performed.  The abdominal peritoneum was reapproximated with 2-0 Vicryl in a running fashion, the rectus muscles was reapproximated with 2-0 vicryl in a running fashion. The fascia was closed with a looped PDS in a running fashion. The skin was closed with 4-0 vicryl in a subcuticular fashion and Dermabond. All sponge lap and needle counts were correct. The patient was extubated in the OR and trnasferred to the PACU in stable condition.

## 2018-07-28 LAB — CBC
HCT: 30.7 % — ABNORMAL LOW (ref 36.0–46.0)
HEMOGLOBIN: 10 g/dL — AB (ref 12.0–15.0)
MCH: 30.7 pg (ref 26.0–34.0)
MCHC: 32.6 g/dL (ref 30.0–36.0)
MCV: 94.2 fL (ref 80.0–100.0)
Platelets: 224 10*3/uL (ref 150–400)
RBC: 3.26 MIL/uL — ABNORMAL LOW (ref 3.87–5.11)
RDW: 14.1 % (ref 11.5–15.5)
WBC: 11.4 10*3/uL — ABNORMAL HIGH (ref 4.0–10.5)
nRBC: 0 % (ref 0.0–0.2)

## 2018-07-28 LAB — RPR: RPR Ser Ql: NONREACTIVE

## 2018-07-28 MED ORDER — IBUPROFEN 600 MG PO TABS
600.0000 mg | ORAL_TABLET | Freq: Four times a day (QID) | ORAL | Status: DC
  Administered 2018-07-28 – 2018-07-29 (×5): 600 mg via ORAL
  Filled 2018-07-28 (×5): qty 1

## 2018-07-28 NOTE — Progress Notes (Signed)
POD#1 Pt without complaints. Now off PCA. Lochia mild VS- b/ps improving. HGB-10 IMP/ Stable Plan/ Routine care

## 2018-07-29 ENCOUNTER — Inpatient Hospital Stay (HOSPITAL_COMMUNITY): Payer: BLUE CROSS/BLUE SHIELD

## 2018-07-29 MED ORDER — IBUPROFEN 600 MG PO TABS: 600.0000 mg | ORAL_TABLET | Freq: Four times a day (QID) | ORAL | 0 refills | Status: DC

## 2018-07-29 MED ORDER — OXYCODONE HCL 5 MG PO TABS: 5.0000 mg | ORAL_TABLET | Freq: Four times a day (QID) | ORAL | 0 refills | Status: DC | PRN

## 2018-07-29 NOTE — Discharge Instructions (Signed)
You may wash incision with soap and water.    Do not soak the incision for 2 weeks (no tub baths or swimming).    Keep incision dry. You may need to keep a sanitary pad or panty liner between the incision and your clothing for comfort and to keep the incision dry.  If you note drainage, increased pain, or increased redness of the incision, then please notify your physician.  Pelvic rest x 6 weeks (no intercourse or tampons)   No lifting over 10-15 lbs for 6 weeks.   Do not drive until you are not taking narcotic pain medication AND you can comfortably slam on the brakes.  You have a mild anemia due to blood loss from delivery.  Please continue to take a prenatal vitamin daily.  You may experience constipation from the iron and oxycodone, so add a stool softener as needed.

## 2018-07-29 NOTE — Progress Notes (Signed)
CSW received and acknowledges consult for EDPS of 9.  Consult screened out due to 9 on EDPS does not warrant a CSW consult.  MOB whom scores are greater than 9/yes to question 10 on Edinburgh Postpartum Depression Screen warrants a CSW consult.   Rondia Higginbotham, LCSW Clinical Social Worker [facility] Cell#: [phone]  

## 2018-07-29 NOTE — Progress Notes (Signed)
Patient is doing well.  She is tolerating PO, ambulating, voiding.  Pain is controlled.  Lochia is appropriate  Vitals:   07/28/18 1001 07/28/18 1435 07/28/18 2227 07/29/18 0533  BP:  118/84 108/77 129/83  Pulse:  94 96 84  Resp: 17 16 17 16   Temp:  98.3 F (36.8 C) 98.4 F (36.9 C) 97.7 F (36.5 C)  TempSrc:  Oral Oral Oral  SpO2: 95% 97%  98%    NAD Abdomen:  soft, appropriate tenderness, incisions intact and without erythema or drainage ext:    Symmetric, trace edema edema bilaterally  Lab Results  Component Value Date   WBC 11.4 (H) 07/28/2018   HGB 10.0 (L) 07/28/2018   HCT 30.7 (L) 07/28/2018   MCV 94.2 07/28/2018   PLT 224 07/28/2018   O+   /RImmune  A/P    33 y.o. G2P2002 POD 2 s/p primary c/s for cord prolapse Routine post op and postpartum care.   Meeting all goals, d/c to home today

## 2018-07-29 NOTE — Discharge Summary (Signed)
Obstetric Discharge Summary Reason for Admission: cesarean section and spontaneous abortion Prenatal Procedures: none Intrapartum Procedures: cesarean: low cervical, transverse--cord prolapse Postpartum Procedures: none Complications-Operative and Postpartum: none Hemoglobin  Date Value Ref Range Status  07/28/2018 10.0 (L) 12.0 - 15.0 g/dL Final   HCT  Date Value Ref Range Status  07/28/2018 30.7 (L) 36.0 - 46.0 % Final    Physical Exam:  General: alert, cooperative and appears stated age 33: appropriate Uterine Fundus: firm Incision: healing well, no significant drainage, no significant erythema DVT Evaluation: No evidence of DVT seen on physical exam.  Discharge Diagnoses: Term Pregnancy-delivered  Discharge Information: Date: 07/29/2018 Activity: pelvic rest Diet: routine Medications: PNV, Ibuprofen and tylenol, oxycodone Condition: stable Instructions: refer to practice specific booklet Discharge to: home [place].   Why:  Mom is calling Contact information: Fax [phone]       Vanessa Kick, MD Follow up in 4 week(s).   Specialty:  Obstetrics and Gynecology Contact information: [place] [address] [phone]           Newborn Data: Live born female  Birth Weight: 9 lb 12.1 oz (4425 g) APGAR: 4, 9  Newborn Delivery   Birth date/time:  07/27/2018 10:18:00 Delivery type:  C-Section, Low Transverse Trial of labor:  No C-section categorization:  Primary     Home with mother.  [place] 07/29/2018, 8:37 AM

## 2018-08-05 ENCOUNTER — Encounter (HOSPITAL_COMMUNITY): Payer: BLUE CROSS/BLUE SHIELD

## 2019-07-11 ENCOUNTER — Ambulatory Visit (INDEPENDENT_AMBULATORY_CARE_PROVIDER_SITE_OTHER): Payer: BC Managed Care – PPO | Admitting: Family Medicine

## 2019-07-11 ENCOUNTER — Encounter: Payer: Self-pay | Admitting: Family Medicine

## 2019-07-11 ENCOUNTER — Other Ambulatory Visit: Payer: Self-pay

## 2019-07-11 VITALS — BP 105/68 | HR 74 | Temp 98.2°F | Resp 16 | Ht 62.95 in | Wt 165.4 lb

## 2019-07-11 DIAGNOSIS — R5383 Other fatigue: Secondary | ICD-10-CM

## 2019-07-11 DIAGNOSIS — J0100 Acute maxillary sinusitis, unspecified: Secondary | ICD-10-CM

## 2019-07-11 DIAGNOSIS — Z3009 Encounter for other general counseling and advice on contraception: Secondary | ICD-10-CM

## 2019-07-11 DIAGNOSIS — R110 Nausea: Secondary | ICD-10-CM | POA: Diagnosis not present

## 2019-07-11 DIAGNOSIS — K219 Gastro-esophageal reflux disease without esophagitis: Secondary | ICD-10-CM

## 2019-07-11 DIAGNOSIS — F419 Anxiety disorder, unspecified: Secondary | ICD-10-CM

## 2019-07-11 DIAGNOSIS — E282 Polycystic ovarian syndrome: Secondary | ICD-10-CM | POA: Insufficient documentation

## 2019-07-11 DIAGNOSIS — R531 Weakness: Secondary | ICD-10-CM

## 2019-07-11 LAB — POCT URINE PREGNANCY: Preg Test, Ur: NEGATIVE

## 2019-07-11 NOTE — Patient Instructions (Signed)
Pleasure to meet you today. We will call you with results as they become available.  We will guide you on further plan depending upon those results.   If work up is negative- would suggest setting up a covid test at [facility]. You can text the word "COVID" to [phone]- they will contact you with appt.    Fatigue If you have fatigue, you feel tired all the time and have a lack of energy or a lack of motivation. Fatigue may make it difficult to start or complete tasks because of exhaustion. In general, occasional or mild fatigue is often a normal response to activity or life. However, long-lasting (chronic) or extreme fatigue may be a symptom of a medical condition. Follow these instructions at home: General instructions  Watch your fatigue for any changes.  Go to bed and get up at the same time every day.  Avoid fatigue by pacing yourself during the day and getting enough sleep at night.  Maintain a healthy weight. Medicines  Take over-the-counter and prescription medicines only as told by your health care provider.  Take a multivitamin, if told by your health care provider.  Do not use herbal or dietary supplements unless they are approved by your health care provider. Activity   Exercise regularly, as told by your health care provider.  Use or practice techniques to help you relax, such as yoga, tai chi, meditation, or massage therapy. Eating and drinking   Avoid heavy meals in the evening.  Eat a well-balanced diet, which includes lean proteins, whole grains, plenty of fruits and vegetables, and low-fat dairy products.  Avoid consuming too much caffeine.  Avoid the use of alcohol.  Drink enough fluid to keep your urine pale yellow. Lifestyle  Change situations that cause you stress. Try to keep your work and personal schedule in balance.  Do not use any products that contain nicotine or tobacco, such as cigarettes and e-cigarettes. If you need help quitting,  ask your health care provider.  Do not use drugs. Contact a health care provider if:  Your fatigue does not get better.  You have a fever.  You suddenly lose or gain weight.  You have headaches.  You have trouble falling asleep or sleeping through the night.  You feel angry, guilty, anxious, or sad.  You are unable to have a bowel movement (constipation).  Your skin is dry.  You have swelling in your legs or another part of your body. Get help right away if:  You feel confused.  Your vision is blurry.  You feel faint or you pass out.  You have a severe headache.  You have severe pain in your abdomen, your back, or the area between your waist and hips (pelvis).  You have chest pain, shortness of breath, or an irregular or fast heartbeat.  You are unable to urinate, or you urinate less than normal.  You have abnormal bleeding, such as bleeding from the rectum, vagina, nose, lungs, or nipples.  You vomit blood.  You have thoughts about hurting yourself or others. If you ever feel like you may hurt yourself or others, or have thoughts about taking your own life, get help right away. You can go to your nearest emergency department or call:  Your local emergency services (911 in the [place]  A suicide crisis helpline, such as the [place] at 402-102-0685. This is open 24 hours a day. Summary  If you have fatigue, you feel tired all  the time and have a lack of energy or a lack of motivation.  Fatigue may make it difficult to start or complete tasks because of exhaustion.  Long-lasting (chronic) or extreme fatigue may be a symptom of a medical condition.  Exercise regularly, as told by your health care provider.  Change situations that cause you stress. Try to keep your work and personal schedule in balance. This information is not intended to replace advice given to you by your health care provider. Make sure you discuss any questions  you have with your health care provider. Document Revised: 12/08/2018 Document Reviewed: 02/11/2017 Elsevier Patient Education  2020 [employer].

## 2019-07-11 NOTE — Progress Notes (Addendum)
This visit occurred during the SARS-CoV-2 public health emergency.  Safety protocols were in place, including screening questions prior to the visit, additional usage of staff PPE, and extensive cleaning of exam room while observing appropriate contact time as indicated for disinfecting solutions.    Jacqueline Davidson , 01-07-1986, 34 y.o., female MRN: [id number] Patient Care Team    Relationship Specialty Notifications Start End  Jacqueline Hillock, DO PCP - General Family Medicine  07/11/19   Jacqueline Davidson, OD  Optometry  07/12/19   Jacqueline Morin, PA  Obstetrics and Gynecology  07/12/19     Chief Complaint  Patient presents with  . Establish Care    PCP Dr Jacqueline Davidson, has not seen in ten + years. Pt has just been going to GYN. Pt has been having weakness/fatigue with nausea that started Monday. Comes and goes, not constant. Denies fever or V/D.  Started new Diet in Sept.      Subjective: Pt presents for an OV to establish care with complaints of of feeling run down of 1 week duration.  Associated symptoms include no energy, mild nausea, felt jittery and myalgias of her legs.  She reports an abrupt onset that started last Monday.  Denies fever, chills, vomiting.Patient's last menstrual period was 06/17/2019 (exact date).  She denies breast tenderness.   Depression screen PHQ 2/9 07/11/2019  Decreased Interest 0  Down, Depressed, Hopeless 0  PHQ - 2 Score 0    No Known Allergies Social History   Social History Narrative   Marital status/children/pets: Married, 2 children   Education/employment: Programmer, systems, employed at [employer] [place].   Safety:      -Wears a bicycle helmet riding a bike: Yes     -smoke alarm in the home:Yes     - wears seatbelt: Yes     - Feels safe in their relationships: Yes   Past Medical History:  Diagnosis Date  . Breast mass, left 03/04/2011   Patient was scheduled for surgery on 05-09-2011.  Patient called to cancel due to upcoming marriage.  Will  call back to reschedule in future. tmg   . GERD (gastroesophageal reflux disease)   . History of UTI   . Hx of varicella   . Left breast mass   . Umbilical cord prolapse 9/74/1638  . Vaginal delivery 10/10/2015   Past Surgical History:  Procedure Laterality Date  . CESAREAN SECTION N/A 07/27/2018   Procedure: CESAREAN SECTION;  Surgeon: Truett Mainland, DO;  Location: [facility];  Service: Obstetrics;  Laterality: N/A;  . left breast lumpectomy  06/03/2011   no evidence of malignancy  . TYMPANOSTOMY TUBE PLACEMENT  34 years old  . WISDOM TOOTH EXTRACTION     Family History  Problem Relation Age of Onset  . Esophageal cancer Father   . Early death Father   . Diabetes Sister   . Diabetes Maternal Grandfather   . Diabetes Paternal Grandmother    Allergies as of 07/11/2019   No Known Allergies     Medication List       Accurate as of July 11, 2019 11:59 PM. If you have any questions, ask your nurse or doctor.        STOP taking these medications   acetaminophen 325 MG tablet Commonly known as: TYLENOL Stopped by: Howard Pouch, DO   ibuprofen 600 MG tablet Commonly known as: ADVIL Stopped by: Howard Pouch, DO   oxyCODONE 5 MG immediate  release tablet Commonly known as: Oxy IR/ROXICODONE Stopped by: Howard Pouch, DO   prenatal multivitamin Tabs tablet Stopped by: Howard Pouch, DO     TAKE these medications   calcium carbonate 500 MG chewable tablet Commonly known as: TUMS - dosed in mg elemental calcium Chew 1-2 tablets by mouth daily as needed for indigestion or heartburn.   multivitamin tablet Take 1 tablet by mouth daily.   Sambucus Elderberry 50 MG/5ML Syrp Generic drug: Black Elderberry Take by mouth.   VITA-C PO Take by mouth.   VITAMIN D PO Take by mouth.   Zinc 100 MG Tabs Take by mouth.       All past medical history, surgical history, allergies, family history, immunizations andmedications were updated in the EMR today and reviewed under  the history and medication portions of their EMR.     ROS: Negative, with the exception of above mentioned in HPI   Objective:  BP 105/68 (BP Location: Right Arm, Patient Position: Sitting, Cuff Size: Normal)   Pulse 74   Temp 98.2 F (36.8 C) (Temporal)   Resp 16   Ht 5' 2.95" (1.599 m)   Wt 165 lb 6 oz (75 kg)   LMP 06/17/2019 (Exact Date)   SpO2 98%   Breastfeeding No   BMI 29.34 kg/m  Body mass index is 29.34 kg/m. Gen: Afebrile. No acute distress. Nontoxic in appearance, well developed, well nourished.  Pleasant Caucasian female. HENT: AT. [place]. Bilateral TM visualized without erythema or bulging.. MMM, no oral lesions. Bilateral nares with erythema, swelling and drainage. Throat without erythema or exudates.  Postnasal drip present.  No cough or shortness of breath present. Eyes:Pupils Equal Round Reactive to light, Extraocular movements intact,  Conjunctiva without redness, discharge or icterus. Neck/lymp/endocrine: Supple, no lymphadenopathy, no thyromegaly CV: RRR no murmur, no edema Chest: CTAB, no wheeze or crackles. Good air movement, normal resp effort.  Abd: Soft. NTND. BS present Skin: No rashes, purpura or petechiae.  Neuro:  Normal gait. PERLA. EOMi. Alert. Oriented x3  Psych: Normal affect, dress and demeanor. Normal speech. Normal thought content and judgment.  No exam data present No results found. No results found for this or any previous visit (from the past 24 hour(s)).  Assessment/Plan: Jacqueline Davidson is a 34 y.o. female present for OV for  Fatigue/weakness/nausea Given abrupt constellation of symptoms and urine pregnancy is negative today.  Will rule out other vitamin deficiencies, iron deficiency, thyroid disorder and anemia. - CBC w/Diff - Iron, TIBC and Ferritin Panel - B12 - Comp Met (CMET) - Hemoglobin A1c - Vitamin D (25 hydroxy) - POCT urine pregnancy - TSH - T4, free -Further evaluation dependent upon lab results above.   Acute  non-recurrent maxillary sinusitis Given acute onset of her symptoms began just a little over a week.  Possibly viral in nature.  She did have some sinusitis and postnasal drip appearing features on exam and elected to start treatment with Augmentin and Flonase. Encouraged to also start antihistamine of choice. Follow-up in 2 weeks if other symptoms remain.      Reviewed expectations re: course of current medical issues.  Discussed self-management of symptoms.  Outlined signs and symptoms indicating need for more acute intervention.  Patient verbalized understanding and all questions were answered.  Patient received an After-Visit Summary.    Orders Placed This Encounter  Procedures  . CBC w/Diff  . Iron, TIBC and Ferritin Panel  . B12  . Comp Met (CMET)  . Hemoglobin A1c  .  Vitamin D (25 hydroxy)  . TSH  . T4, free  . POCT urine pregnancy   No orders of the defined types were placed in this encounter.  Referral Orders  No referral(s) requested today     Note is dictated utilizing voice recognition software. Although note has been proof read prior to signing, occasional typographical errors still can be missed. If any questions arise, please do not hesitate to call for verification.   electronically signed by:  Howard Pouch, DO  [place]

## 2019-07-12 ENCOUNTER — Encounter: Payer: Self-pay | Admitting: Family Medicine

## 2019-07-12 ENCOUNTER — Telehealth: Payer: Self-pay | Admitting: Family Medicine

## 2019-07-12 DIAGNOSIS — R110 Nausea: Secondary | ICD-10-CM | POA: Insufficient documentation

## 2019-07-12 DIAGNOSIS — R5383 Other fatigue: Secondary | ICD-10-CM | POA: Insufficient documentation

## 2019-07-12 DIAGNOSIS — R0981 Nasal congestion: Secondary | ICD-10-CM

## 2019-07-12 DIAGNOSIS — J0100 Acute maxillary sinusitis, unspecified: Secondary | ICD-10-CM | POA: Insufficient documentation

## 2019-07-12 DIAGNOSIS — E559 Vitamin D deficiency, unspecified: Secondary | ICD-10-CM | POA: Insufficient documentation

## 2019-07-12 LAB — COMPREHENSIVE METABOLIC PANEL
AG Ratio: 1.8 (calc) (ref 1.0–2.5)
ALT: 12 U/L (ref 6–29)
AST: 12 U/L (ref 10–30)
Albumin: 4.4 g/dL (ref 3.6–5.1)
Alkaline phosphatase (APISO): 45 U/L (ref 31–125)
BUN: 14 mg/dL (ref 7–25)
CO2: 26 mmol/L (ref 20–32)
Calcium: 9.7 mg/dL (ref 8.6–10.2)
Chloride: 103 mmol/L (ref 98–110)
Creat: 0.65 mg/dL (ref 0.50–1.10)
Globulin: 2.4 g/dL (calc) (ref 1.9–3.7)
Glucose, Bld: 97 mg/dL (ref 65–99)
Potassium: 4 mmol/L (ref 3.5–5.3)
Sodium: 139 mmol/L (ref 135–146)
Total Bilirubin: 0.6 mg/dL (ref 0.2–1.2)
Total Protein: 6.8 g/dL (ref 6.1–8.1)

## 2019-07-12 LAB — CBC WITH DIFFERENTIAL/PLATELET
Absolute Monocytes: 496 cells/uL (ref 200–950)
Basophils Absolute: 32 cells/uL (ref 0–200)
Basophils Relative: 0.4 %
Eosinophils Absolute: 32 cells/uL (ref 15–500)
Eosinophils Relative: 0.4 %
HCT: 41.9 % (ref 35.0–45.0)
Hemoglobin: 14.3 g/dL (ref 11.7–15.5)
Lymphs Abs: 1944 cells/uL (ref 850–3900)
MCH: 31.4 pg (ref 27.0–33.0)
MCHC: 34.1 g/dL (ref 32.0–36.0)
MCV: 91.9 fL (ref 80.0–100.0)
MPV: 9.7 fL (ref 7.5–12.5)
Monocytes Relative: 6.2 %
Neutro Abs: 5496 cells/uL (ref 1500–7800)
Neutrophils Relative %: 68.7 %
Platelets: 289 10*3/uL (ref 140–400)
RBC: 4.56 10*6/uL (ref 3.80–5.10)
RDW: 12.2 % (ref 11.0–15.0)
Total Lymphocyte: 24.3 %
WBC: 8 10*3/uL (ref 3.8–10.8)

## 2019-07-12 LAB — T4, FREE: Free T4: 1.1 ng/dL (ref 0.8–1.8)

## 2019-07-12 LAB — VITAMIN D 25 HYDROXY (VIT D DEFICIENCY, FRACTURES): Vit D, 25-Hydroxy: 22 ng/mL — ABNORMAL LOW (ref 30–100)

## 2019-07-12 LAB — VITAMIN B12: Vitamin B-12: 386 pg/mL (ref 200–1100)

## 2019-07-12 LAB — IRON,TIBC AND FERRITIN PANEL
%SAT: 34 % (calc) (ref 16–45)
Ferritin: 25 ng/mL (ref 16–154)
Iron: 102 ug/dL (ref 40–190)
TIBC: 303 mcg/dL (calc) (ref 250–450)

## 2019-07-12 LAB — TSH: TSH: 1.26 mIU/L

## 2019-07-12 LAB — HEMOGLOBIN A1C
Hgb A1c MFr Bld: 5 % of total Hgb (ref <5.7)
Mean Plasma Glucose: 97 (calc)
eAG (mmol/L): 5.4 (calc)

## 2019-07-12 MED ORDER — VITAMIN D (ERGOCALCIFEROL) 1.25 MG (50000 UNIT) PO CAPS: 50000.0000 [IU] | ORAL_CAPSULE | ORAL | 0 refills | Status: DC

## 2019-07-12 MED ORDER — FLUTICASONE PROPIONATE 50 MCG/ACT NA SUSP: 2.0000 | Freq: Every day | NASAL | 2 refills | Status: DC

## 2019-07-12 MED ORDER — AMOXICILLIN-POT CLAVULANATE 875-125 MG PO TABS: 1.0000 | ORAL_TABLET | Freq: Two times a day (BID) | ORAL | 0 refills | Status: DC

## 2019-07-12 NOTE — Telephone Encounter (Signed)
Pt was called and given information, she verbalized understanding. Sent information/instructions via my chart

## 2019-07-12 NOTE — Telephone Encounter (Signed)
Please inform patient the following information: -Her liver, kidney, electrolytes iron level and blood cell counts are all normal. - Diabetes screen was normal. - Thyroid panel was normal. - Her vitamin D is low at 22.  Normal is above 30 and ideally for bone health in women levels between 40-50 are desired.  I have called in a once weekly high-dose vitamin D for her to start. On her med list she had listed she was taking a vitamin D supplementation, but no dose was reported. I would encourage her to take 1000 units daily.  If she is already taking 1000 units daily I would increase to 2000 units daily. (Please update med list)  Her B12 was in normal range, but the lower end of normal range.  She was also benefit from adding vitamin B12 [phone] mcg a day.  Increasing her vitamin D and B12 levels will help some with increasing her energy level.  However,  given the quick onset of her symptoms being 1 week and her exam showed rather significant sinusitis.  I would recommend starting a daily over-the-counter antihistamine of her choice (Zyrtec, Allegra, Xyzal), start Flonase nasal spray daily, start Mucinex over-the-counter to decrease her postnasal drip which causes nausea and start a short course of antibiotics to treat sinusitis-since her symptoms onset has been greater than 1 week.  I have called in the antibiotic, Flonase and the high-dose vitamin D supplementation. The rest of the suggested medications are over-the-counter  Follow-up in 3-4 weeks if not seeing any improvement in symptoms and we will pursue further evaluation.  If she is seen some improvement would follow-up in 10-12 weeks to recheck vitamin levels with provider appointment

## 2019-07-18 ENCOUNTER — Encounter: Payer: Self-pay | Admitting: Family Medicine

## 2019-07-18 DIAGNOSIS — Z3009 Encounter for other general counseling and advice on contraception: Secondary | ICD-10-CM | POA: Insufficient documentation

## 2019-07-18 DIAGNOSIS — F419 Anxiety disorder, unspecified: Secondary | ICD-10-CM | POA: Insufficient documentation

## 2019-07-18 DIAGNOSIS — K219 Gastro-esophageal reflux disease without esophagitis: Secondary | ICD-10-CM | POA: Insufficient documentation

## 2019-07-18 NOTE — Addendum Note (Signed)
Addended by: Howard Pouch A on: 07/18/2019 10:57 AM   Modules accepted: Orders

## 2019-08-30 ENCOUNTER — Encounter: Payer: BC Managed Care – PPO | Admitting: Medical

## 2019-09-21 ENCOUNTER — Ambulatory Visit: Payer: BC Managed Care – PPO | Admitting: Family Medicine

## 2019-11-03 DIAGNOSIS — Z20822 Contact with and (suspected) exposure to covid-19: Secondary | ICD-10-CM | POA: Diagnosis not present

## 2019-11-03 DIAGNOSIS — B349 Viral infection, unspecified: Secondary | ICD-10-CM | POA: Diagnosis not present

## 2020-01-16 ENCOUNTER — Telehealth: Payer: Self-pay

## 2020-01-16 NOTE — Telephone Encounter (Signed)
Patient is requesting a call back regarding her last test results. She is also requesting a print out, she cannot see them on MyChart.

## 2020-01-16 NOTE — Telephone Encounter (Signed)
Patient advised of lab results from Feb. And scheduled patient follow up appointment.

## 2020-01-26 ENCOUNTER — Telehealth: Payer: Self-pay

## 2020-01-26 NOTE — Telephone Encounter (Signed)
Request to speak to a nurse.  No message details.  Please call patient [phone]

## 2020-01-26 NOTE — Telephone Encounter (Signed)
LM requesting call back.  

## 2020-01-31 NOTE — Telephone Encounter (Signed)
Spoke with patient and she advised will follow up with PCP tomorrow with concerns. Nothing further needed.

## 2020-02-01 ENCOUNTER — Other Ambulatory Visit: Payer: Self-pay

## 2020-02-01 ENCOUNTER — Encounter: Payer: Self-pay | Admitting: Family Medicine

## 2020-02-01 ENCOUNTER — Ambulatory Visit (INDEPENDENT_AMBULATORY_CARE_PROVIDER_SITE_OTHER): Payer: BC Managed Care – PPO | Admitting: Family Medicine

## 2020-02-01 VITALS — BP 110/68 | HR 78 | Temp 98.1°F | Ht 62.0 in | Wt 166.4 lb

## 2020-02-01 DIAGNOSIS — M791 Myalgia, unspecified site: Secondary | ICD-10-CM

## 2020-02-01 DIAGNOSIS — Z8269 Family history of other diseases of the musculoskeletal system and connective tissue: Secondary | ICD-10-CM

## 2020-02-01 DIAGNOSIS — Z840 Family history of diseases of the skin and subcutaneous tissue: Secondary | ICD-10-CM

## 2020-02-01 DIAGNOSIS — E559 Vitamin D deficiency, unspecified: Secondary | ICD-10-CM

## 2020-02-01 DIAGNOSIS — M2550 Pain in unspecified joint: Secondary | ICD-10-CM

## 2020-02-01 DIAGNOSIS — R5383 Other fatigue: Secondary | ICD-10-CM | POA: Diagnosis not present

## 2020-02-01 DIAGNOSIS — IMO0002 Reserved for concepts with insufficient information to code with codable children: Secondary | ICD-10-CM

## 2020-02-01 LAB — COMPREHENSIVE METABOLIC PANEL
ALT: 9 U/L (ref 0–35)
AST: 10 U/L (ref 0–37)
Albumin: 4.7 g/dL (ref 3.5–5.2)
Alkaline Phosphatase: 47 U/L (ref 39–117)
BUN: 13 mg/dL (ref 6–23)
CO2: 28 mEq/L (ref 19–32)
Calcium: 9.8 mg/dL (ref 8.4–10.5)
Chloride: 104 mEq/L (ref 96–112)
Creatinine, Ser: 0.67 mg/dL (ref 0.40–1.20)
GFR: 100.74 mL/min (ref >60.00)
Glucose, Bld: 92 mg/dL (ref 70–99)
Potassium: 4.3 mEq/L (ref 3.5–5.1)
Sodium: 138 mEq/L (ref 135–145)
Total Bilirubin: 0.6 mg/dL (ref 0.2–1.2)
Total Protein: 6.9 g/dL (ref 6.0–8.3)

## 2020-02-01 LAB — CK: Total CK: 55 U/L (ref 7–177)

## 2020-02-01 LAB — MAGNESIUM: Magnesium: 2 mg/dL (ref 1.5–2.5)

## 2020-02-01 LAB — T3, FREE: T3, Free: 3.7 pg/mL (ref 2.3–4.2)

## 2020-02-01 LAB — VITAMIN D 25 HYDROXY (VIT D DEFICIENCY, FRACTURES): VITD: 36.83 ng/mL (ref 30.00–100.00)

## 2020-02-01 LAB — TSH: TSH: 1.68 u[IU]/mL (ref 0.35–4.50)

## 2020-02-01 LAB — T4, FREE: Free T4: 0.8 ng/dL (ref 0.60–1.60)

## 2020-02-01 LAB — VITAMIN B12: Vitamin B-12: >1526 pg/mL — ABNORMAL HIGH (ref 211–911)

## 2020-02-01 NOTE — Patient Instructions (Signed)
We will call you with results and guide you further.

## 2020-02-01 NOTE — Progress Notes (Signed)
This visit occurred during the SARS-CoV-2 public health emergency.  Safety protocols were in place, including screening questions prior to the visit, additional usage of staff PPE, and extensive cleaning of exam room while observing appropriate contact time as indicated for disinfecting solutions.    Jacqueline Davidson , [birth year], 34 y.o., female MRN: [id number] Patient Care Team    Relationship Specialty Notifications Start End  Ma Hillock, DO PCP - General Family Medicine  07/11/19   Shirl Harris, OD  Optometry  07/12/19   Alvester Morin, PA  Obstetrics and Gynecology  07/12/19     Chief Complaint  Patient presents with  . Follow-up    vitamin D     Subjective: Pt presents for an OV today to discuss her fatigue.  She states she did initially feel better after starting the vitamin D high-dose supplementation approximately 6 months ago.  She admits she did not complete the entire 12 weeks because she felt better after a few of the tabs.  She did just restart 01/14/2020.  She states she has been taking vitamin D 5000 units daily.  She also started B12 on that same day.  For her allergies she has started Flonase and Claritin daily.  She is taking magnesium 250 mg daily.  And she went ahead and started iron 3 times a week.  She states in August she started having headaches and joint pain with decreased energy again.  She does note that she has more family history to report after speaking with her family she is noted there is scleroderma history in her maternal grandfather and her maternal grandmother has supranuclear palsy.  There is also a family history of lupus in her maternal aunt and cousins. Prior note:  feeling run down of 1 week duration.  Associated symptoms include no energy, mild nausea, felt jittery and myalgias of her legs.  She reports an abrupt onset that started last Monday.  Denies fever, chills, vomiting.No LMP recorded. (Menstrual status: Irregular Periods).  She denies  breast tenderness.   Depression screen PHQ 2/9 07/11/2019  Decreased Interest 0  Down, Depressed, Hopeless 0  PHQ - 2 Score 0    No Known Allergies Social History   Social History Narrative   Marital status/children/pets: Married, 2 children   Education/employment: Programmer, systems, employed at [employer] [place].   Safety:      -Wears a bicycle helmet riding a bike: Yes     -smoke alarm in the home:Yes     - wears seatbelt: Yes     - Feels safe in their relationships: Yes   Past Medical History:  Diagnosis Date  . Breast mass, left 03/04/2011   Patient was scheduled for surgery on 05-09-2011.  Patient called to cancel due to upcoming marriage.  Will call back to reschedule in future. tmg   . GERD (gastroesophageal reflux disease)   . History of UTI   . Hx of varicella   . Left breast mass   . Umbilical cord prolapse 4/97/0263  . Vaginal delivery 10/10/2015   Past Surgical History:  Procedure Laterality Date  . CESAREAN SECTION N/A 07/27/2018   Procedure: CESAREAN SECTION;  Surgeon: Truett Mainland, DO;  Location: [facility];  Service: Obstetrics;  Laterality: N/A;  . left breast lumpectomy  06/03/2011   no evidence of malignancy  . TYMPANOSTOMY TUBE PLACEMENT  34 years old  . WISDOM TOOTH EXTRACTION     Family History  Problem Relation Age of Onset  . Esophageal cancer Father   . Early death Father   . Diabetes Sister   . Diabetes Maternal Grandfather   . Diabetes Paternal Grandmother    Allergies as of 02/01/2020   No Known Allergies     Medication List       Accurate as of February 01, 2020  9:11 AM. If you have any questions, ask your nurse or doctor.        STOP taking these medications   amoxicillin-clavulanate 875-125 MG tablet Commonly known as: AUGMENTIN Stopped by: Howard Pouch, DO     TAKE these medications   calcium carbonate 500 MG chewable tablet Commonly known as: TUMS - dosed in mg elemental calcium Chew 1-2 tablets by mouth daily as  needed for indigestion or heartburn.   fluticasone 50 MCG/ACT nasal spray Commonly known as: FLONASE Place 2 sprays into both nostrils daily.   multivitamin tablet Take 1 tablet by mouth daily.   Sambucus Elderberry 50 MG/5ML Syrp Generic drug: Black Elderberry Take by mouth.   VITA-C PO Take by mouth.   Vitamin D (Ergocalciferol) 1.25 MG (50000 UNIT) Caps capsule Commonly known as: DRISDOL Take 1 capsule (50,000 Units total) by mouth every 7 (seven) days.   VITAMIN D PO Take by mouth.   Zinc 100 MG Tabs Take by mouth.       All past medical history, surgical history, allergies, family history, immunizations andmedications were updated in the EMR today and reviewed under the history and medication portions of their EMR.     ROS: Negative, with the exception of above mentioned in HPI   Objective:  BP 110/68 (BP Location: Left Arm, Patient Position: Sitting, Cuff Size: Normal)   Pulse 78   Temp 98.1 F (36.7 C) (Oral)   Ht 5\' 2"  (1.575 m)   Wt 166 lb 6 oz (75.5 kg)   SpO2 97%   BMI 30.43 kg/m  Body mass index is 30.43 kg/m. Gen: Afebrile. No acute distress.  HENT: AT. [place].  Eyes:Pupils Equal Round Reactive to light, Extraocular movements intact,  Conjunctiva without redness, discharge or icterus. CV: RRR no murmur, no edema, +2/4 P posterior tibialis pulses Chest: CTAB, no wheeze or crackles Skin: No rashes, purpura or petechiae.  Neuro:  Normal gait. PERLA. EOMi. Alert. Oriented x3  Psych: Normal affect, dress and demeanor. Normal speech. Normal thought content and judgment.   No exam data present No results found. No results found for this or any previous visit (from the past 24 hour(s)).  Assessment/Plan: [name] is a 34 y.o. female present for OV for  Fatigue/weakness Uncertain etiology of her symptoms.  She reports a quick recovery of her fatigue within a few days to a week after her last visit.  New onset symptoms started approximately 4 weeks  ago with headache, joint pain and decreased energy. Recheck vitamin D, B12 and thyroid today. -Iron, magnesium levels also obtained since she is taking supplementation -CK -CMP -CCP, RA -Lyme -ANA Follow-up dependent upon laboratory findings.     Reviewed expectations re: course of current medical issues.  Discussed self-management of symptoms.  Outlined signs and symptoms indicating need for more acute intervention.  Patient verbalized understanding and all questions were answered.  Patient received an After-Visit Summary.    No orders of the defined types were placed in this encounter.  No orders of the defined types were placed in this encounter.  Referral Orders  No referral(s) requested today  Note is dictated utilizing voice recognition software. Although note has been proof read prior to signing, occasional typographical errors still can be missed. If any questions arise, please do not hesitate to call for verification.   electronically signed by:  Howard Pouch, DO  [place]

## 2020-02-02 ENCOUNTER — Encounter: Payer: Self-pay | Admitting: Family Medicine

## 2020-02-02 LAB — THYROID PEROXIDASE ANTIBODY: Thyroperoxidase Ab SerPl-aCnc: <1 IU/mL (ref <9)

## 2020-02-02 NOTE — Progress Notes (Signed)
Notified pt of lab results 

## 2020-02-03 LAB — ANA, IFA COMPREHENSIVE PANEL
Anti Nuclear Antibody (ANA): NEGATIVE
ENA SM Ab Ser-aCnc: <1 AI
SM/RNP: <1 AI
SSA (Ro) (ENA) Antibody, IgG: <1 AI
SSB (La) (ENA) Antibody, IgG: <1 AI
Scleroderma (Scl-70) (ENA) Antibody, IgG: <1 AI
ds DNA Ab: <1 IU/mL

## 2020-02-03 LAB — IRON,TIBC AND FERRITIN PANEL
%SAT: 26 % (calc) (ref 16–45)
Ferritin: 52 ng/mL (ref 16–154)
Iron: 80 ug/dL (ref 40–190)
TIBC: 306 mcg/dL (calc) (ref 250–450)

## 2020-02-03 LAB — RHEUMATOID FACTOR: Rheumatoid fact SerPl-aCnc: <14 IU/mL (ref <14)

## 2020-02-03 LAB — B. BURGDORFI ANTIBODIES: B burgdorferi Ab IgG+IgM: <0.9 index

## 2020-02-03 LAB — CYCLIC CITRUL PEPTIDE ANTIBODY, IGG: Cyclic Citrullin Peptide Ab: <16 UNITS

## 2020-03-07 ENCOUNTER — Encounter: Payer: Self-pay | Admitting: Family Medicine

## 2020-03-08 ENCOUNTER — Telehealth: Payer: BC Managed Care – PPO | Admitting: Family Medicine

## 2020-03-08 DIAGNOSIS — R21 Rash and other nonspecific skin eruption: Secondary | ICD-10-CM | POA: Diagnosis not present

## 2020-03-15 DIAGNOSIS — R519 Headache, unspecified: Secondary | ICD-10-CM | POA: Diagnosis not present

## 2020-03-15 DIAGNOSIS — Z03818 Encounter for observation for suspected exposure to other biological agents ruled out: Secondary | ICD-10-CM | POA: Diagnosis not present

## 2020-04-04 DIAGNOSIS — Z124 Encounter for screening for malignant neoplasm of cervix: Secondary | ICD-10-CM | POA: Diagnosis not present

## 2020-04-04 DIAGNOSIS — Z01419 Encounter for gynecological examination (general) (routine) without abnormal findings: Secondary | ICD-10-CM | POA: Diagnosis not present

## 2020-04-04 LAB — HM PAP SMEAR

## 2020-04-17 DIAGNOSIS — N63 Unspecified lump in unspecified breast: Secondary | ICD-10-CM | POA: Diagnosis not present

## 2020-04-20 ENCOUNTER — Other Ambulatory Visit: Payer: Self-pay | Admitting: Obstetrics and Gynecology

## 2020-04-20 DIAGNOSIS — N6320 Unspecified lump in the left breast, unspecified quadrant: Secondary | ICD-10-CM

## 2020-04-23 ENCOUNTER — Other Ambulatory Visit: Payer: Self-pay | Admitting: Obstetrics and Gynecology

## 2020-04-23 DIAGNOSIS — N6320 Unspecified lump in the left breast, unspecified quadrant: Secondary | ICD-10-CM

## 2020-05-28 ENCOUNTER — Other Ambulatory Visit: Payer: Self-pay | Admitting: Obstetrics and Gynecology

## 2020-05-28 ENCOUNTER — Other Ambulatory Visit: Payer: Self-pay

## 2020-05-28 ENCOUNTER — Ambulatory Visit
Admission: RE | Admit: 2020-05-28 | Discharge: 2020-05-28 | Disposition: A | Discharge status: Home / Self Care | Payer: BC Managed Care – PPO | Source: Ambulatory Visit | Attending: Obstetrics and Gynecology | Admitting: Obstetrics and Gynecology

## 2020-05-28 DIAGNOSIS — N6320 Unspecified lump in the left breast, unspecified quadrant: Secondary | ICD-10-CM

## 2020-05-28 DIAGNOSIS — N6321 Unspecified lump in the left breast, upper outer quadrant: Secondary | ICD-10-CM | POA: Diagnosis not present

## 2020-06-06 DIAGNOSIS — Z3201 Encounter for pregnancy test, result positive: Secondary | ICD-10-CM | POA: Diagnosis not present

## 2020-06-06 DIAGNOSIS — Z3480 Encounter for supervision of other normal pregnancy, unspecified trimester: Secondary | ICD-10-CM | POA: Diagnosis not present

## 2020-06-06 DIAGNOSIS — N925 Other specified irregular menstruation: Secondary | ICD-10-CM | POA: Diagnosis not present

## 2020-06-06 DIAGNOSIS — Z3481 Encounter for supervision of other normal pregnancy, first trimester: Secondary | ICD-10-CM | POA: Diagnosis not present

## 2020-07-02 ENCOUNTER — Telehealth (INDEPENDENT_AMBULATORY_CARE_PROVIDER_SITE_OTHER): Payer: BC Managed Care – PPO | Admitting: Family Medicine

## 2020-07-02 ENCOUNTER — Other Ambulatory Visit: Payer: Self-pay

## 2020-07-02 ENCOUNTER — Encounter: Payer: Self-pay | Admitting: Family Medicine

## 2020-07-02 VITALS — HR 117 | Temp 97.4°F

## 2020-07-02 DIAGNOSIS — J329 Chronic sinusitis, unspecified: Secondary | ICD-10-CM

## 2020-07-02 DIAGNOSIS — B9689 Other specified bacterial agents as the cause of diseases classified elsewhere: Secondary | ICD-10-CM

## 2020-07-02 MED ORDER — AMOXICILLIN-POT CLAVULANATE 875-125 MG PO TABS: 1.0000 | ORAL_TABLET | Freq: Two times a day (BID) | ORAL | 0 refills | Status: DC

## 2020-07-02 NOTE — Progress Notes (Signed)
VIRTUAL VISIT VIA VIDEO  I connected with Mable Paris Livecchi on 07/02/20 at  2:30 PM EST by elemedicine application and verified that I am speaking with the correct person using two identifiers. Location patient: Home Location provider: [facility], Office Persons participating in the virtual visit: Patient, Dr. Raoul Pitch and Samul Dada, CMA  I discussed the limitations of evaluation and management by telemedicine and the availability of in person appointments. The patient expressed understanding and agreed to proceed.   SUBJECTIVE Chief Complaint  Patient presents with  . Cough    Pt c/o cough worsening when lying down, nasal congestion, runny nose with yellow and bloody tint, HA  x 1 week ; Neg COVID last Monday     HPI: Jacqueline Davidson is a 35 y.o. female present with dry cough, worse when laying day. She endorses nasal congestion, runny nose with blood tinged nasal drainage. She endorses headache. Neg covid test during illness. She denies fever or chills. [id number] [redacted]w[redacted]d.  ROS: See pertinent positives and negatives per HPI.  Patient Active Problem List   Diagnosis Date Noted  . Anxiety 07/18/2019  . Gastroesophageal reflux disease 07/18/2019  . Birth control counseling 07/18/2019  . Vitamin D deficiency 07/12/2019  . Acute non-recurrent maxillary sinusitis 07/12/2019  . Nausea 07/12/2019  . Other fatigue 07/12/2019  . PCOS (polycystic ovarian syndrome) 07/11/2019    Social History   Tobacco Use  . Smoking status: Never Smoker  . Smokeless tobacco: Never Used  Substance Use Topics  . Alcohol use: Yes    Comment: very rarely    Current Outpatient Medications:  .  amoxicillin-clavulanate (AUGMENTIN) 875-125 MG tablet, Take 1 tablet by mouth 2 (two) times daily., Disp: 20 tablet, Rfl: 0 .  Ascorbic Acid (VITA-C PO), Take by mouth., Disp: , Rfl:  .  Black Elderberry (SAMBUCUS ELDERBERRY) 50 MG/5ML SYRP, Take by mouth., Disp: , Rfl:  .  calcium carbonate  (TUMS - DOSED IN MG ELEMENTAL CALCIUM) 500 MG chewable tablet, Chew 1-2 tablets by mouth daily as needed for indigestion or heartburn., Disp: , Rfl:  .  fluticasone (FLONASE) 50 MCG/ACT nasal spray, Place 2 sprays into both nostrils daily., Disp: 16 g, Rfl: 2 .  Multiple Vitamin (MULTIVITAMIN) tablet, Take 1 tablet by mouth daily., Disp: , Rfl:  .  Prenatal Vit-Fe Fumarate-FA (PRENATAL MULTIVITAMIN) TABS tablet, Take 1 tablet by mouth daily at 12 noon., Disp: , Rfl:  .  VITAMIN D PO, Take by mouth., Disp: , Rfl:  .  Vitamin D, Ergocalciferol, (DRISDOL) 1.25 MG (50000 UNIT) CAPS capsule, Take 1 capsule (50,000 Units total) by mouth every 7 (seven) days., Disp: 12 capsule, Rfl: 0 .  Zinc 100 MG TABS, Take by mouth., Disp: , Rfl:   No Known Allergies  OBJECTIVE: Pulse (!) 117   Temp (!) 97.4 F (36.3 C) (Temporal)   SpO2 100%  Gen: No acute distress. Nontoxic in appearance.  HENT: AT. [place].  MMM.  Eyes:Pupils Equal Round Reactive to light, Extraocular movements intact,  Conjunctiva without redness, discharge or icterus. Chest: Cough not present - no shortness of breath Skin: no rashes, purpura or petechiae.  Neuro:. Alert. Oriented x3  Psych: Normal affect and demeanor. Normal speech. Normal thought content and judgment.  ASSESSMENT AND PLAN: [name] is a 35 y.o. female present for  Bacterial sinusitis Rest, hydrate.  +/- flonase, nettie pot or nasal saline.  Augmentin prescribed, take until completed.  F/U 2 weeks if not improved.  Howard Pouch, DO 07/02/2020   No follow-ups on file.  No orders of the defined types were placed in this encounter.  Meds ordered this encounter  Medications  . amoxicillin-clavulanate (AUGMENTIN) 875-125 MG tablet    Sig: Take 1 tablet by mouth 2 (two) times daily.    Dispense:  20 tablet    Refill:  0   Referral Orders  No referral(s) requested today

## 2020-07-13 DIAGNOSIS — Z3480 Encounter for supervision of other normal pregnancy, unspecified trimester: Secondary | ICD-10-CM | POA: Diagnosis not present

## 2020-07-13 DIAGNOSIS — Z369 Encounter for antenatal screening, unspecified: Secondary | ICD-10-CM | POA: Diagnosis not present

## 2020-07-13 LAB — OB RESULTS CONSOLE HIV ANTIBODY (ROUTINE TESTING): HIV: NONREACTIVE

## 2020-07-13 LAB — OB RESULTS CONSOLE GC/CHLAMYDIA
Chlamydia: NEGATIVE
Gonorrhea: NEGATIVE

## 2020-07-13 LAB — OB RESULTS CONSOLE RUBELLA ANTIBODY, IGM: Rubella: NON-IMMUNE/NOT IMMUNE

## 2020-07-13 LAB — OB RESULTS CONSOLE RPR: RPR: NONREACTIVE

## 2020-07-13 LAB — OB RESULTS CONSOLE HEPATITIS B SURFACE ANTIGEN: Hepatitis B Surface Ag: NEGATIVE

## 2020-07-13 LAB — OB RESULTS CONSOLE ANTIBODY SCREEN: Antibody Screen: NEGATIVE

## 2020-08-01 ENCOUNTER — Other Ambulatory Visit: Payer: Self-pay | Admitting: Obstetrics and Gynecology

## 2020-08-01 DIAGNOSIS — Z3A19 19 weeks gestation of pregnancy: Secondary | ICD-10-CM

## 2020-08-01 DIAGNOSIS — Z363 Encounter for antenatal screening for malformations: Secondary | ICD-10-CM

## 2020-08-01 DIAGNOSIS — Z6832 Body mass index (BMI) 32.0-32.9, adult: Secondary | ICD-10-CM

## 2020-08-08 DIAGNOSIS — Z369 Encounter for antenatal screening, unspecified: Secondary | ICD-10-CM | POA: Diagnosis not present

## 2020-08-08 DIAGNOSIS — Z3482 Encounter for supervision of other normal pregnancy, second trimester: Secondary | ICD-10-CM | POA: Diagnosis not present

## 2020-08-14 ENCOUNTER — Encounter: Payer: Self-pay | Admitting: *Deleted

## 2020-08-17 ENCOUNTER — Ambulatory Visit: Payer: BC Managed Care – PPO | Attending: Obstetrics and Gynecology

## 2020-08-17 ENCOUNTER — Other Ambulatory Visit: Payer: Self-pay

## 2020-08-17 ENCOUNTER — Encounter: Payer: Self-pay | Admitting: *Deleted

## 2020-08-17 ENCOUNTER — Ambulatory Visit: Payer: BC Managed Care – PPO | Admitting: *Deleted

## 2020-08-17 ENCOUNTER — Other Ambulatory Visit: Payer: Self-pay | Admitting: *Deleted

## 2020-08-17 VITALS — BP 110/61 | HR 86

## 2020-08-17 DIAGNOSIS — Z3A19 19 weeks gestation of pregnancy: Secondary | ICD-10-CM | POA: Insufficient documentation

## 2020-08-17 DIAGNOSIS — Z3689 Encounter for other specified antenatal screening: Secondary | ICD-10-CM | POA: Diagnosis not present

## 2020-08-17 DIAGNOSIS — Z363 Encounter for antenatal screening for malformations: Secondary | ICD-10-CM | POA: Insufficient documentation

## 2020-08-17 DIAGNOSIS — Z6832 Body mass index (BMI) 32.0-32.9, adult: Secondary | ICD-10-CM | POA: Diagnosis not present

## 2020-08-17 DIAGNOSIS — Z362 Encounter for other antenatal screening follow-up: Secondary | ICD-10-CM

## 2020-09-07 DIAGNOSIS — Z369 Encounter for antenatal screening, unspecified: Secondary | ICD-10-CM | POA: Diagnosis not present

## 2020-09-18 ENCOUNTER — Encounter: Payer: Self-pay | Admitting: *Deleted

## 2020-09-18 ENCOUNTER — Ambulatory Visit: Payer: BC Managed Care – PPO | Attending: Obstetrics and Gynecology

## 2020-09-18 ENCOUNTER — Other Ambulatory Visit: Payer: Self-pay

## 2020-09-18 ENCOUNTER — Ambulatory Visit: Payer: BC Managed Care – PPO | Admitting: *Deleted

## 2020-09-18 VITALS — BP 128/82 | HR 93

## 2020-09-18 DIAGNOSIS — Z362 Encounter for other antenatal screening follow-up: Secondary | ICD-10-CM | POA: Insufficient documentation

## 2020-09-18 DIAGNOSIS — O34219 Maternal care for unspecified type scar from previous cesarean delivery: Secondary | ICD-10-CM | POA: Insufficient documentation

## 2020-09-18 DIAGNOSIS — Z363 Encounter for antenatal screening for malformations: Secondary | ICD-10-CM | POA: Diagnosis not present

## 2020-09-18 DIAGNOSIS — O09292 Supervision of pregnancy with other poor reproductive or obstetric history, second trimester: Secondary | ICD-10-CM

## 2020-09-18 DIAGNOSIS — O3482 Maternal care for other abnormalities of pelvic organs, second trimester: Secondary | ICD-10-CM

## 2020-09-18 DIAGNOSIS — O322XX0 Maternal care for transverse and oblique lie, not applicable or unspecified: Secondary | ICD-10-CM

## 2020-09-18 DIAGNOSIS — Z8616 Personal history of COVID-19: Secondary | ICD-10-CM | POA: Diagnosis not present

## 2020-09-18 DIAGNOSIS — E282 Polycystic ovarian syndrome: Secondary | ICD-10-CM

## 2020-09-18 DIAGNOSIS — O99342 Other mental disorders complicating pregnancy, second trimester: Secondary | ICD-10-CM

## 2020-09-18 DIAGNOSIS — F419 Anxiety disorder, unspecified: Secondary | ICD-10-CM

## 2020-09-18 DIAGNOSIS — Z3A23 23 weeks gestation of pregnancy: Secondary | ICD-10-CM

## 2020-10-03 DIAGNOSIS — Z369 Encounter for antenatal screening, unspecified: Secondary | ICD-10-CM | POA: Diagnosis not present

## 2020-10-03 DIAGNOSIS — Z3480 Encounter for supervision of other normal pregnancy, unspecified trimester: Secondary | ICD-10-CM | POA: Diagnosis not present

## 2020-10-17 DIAGNOSIS — O99810 Abnormal glucose complicating pregnancy: Secondary | ICD-10-CM | POA: Diagnosis not present

## 2020-10-31 DIAGNOSIS — Z23 Encounter for immunization: Secondary | ICD-10-CM | POA: Diagnosis not present

## 2020-11-15 DIAGNOSIS — Z369 Encounter for antenatal screening, unspecified: Secondary | ICD-10-CM | POA: Diagnosis not present

## 2020-11-28 ENCOUNTER — Other Ambulatory Visit: Payer: BC Managed Care – PPO

## 2020-11-28 DIAGNOSIS — Z369 Encounter for antenatal screening, unspecified: Secondary | ICD-10-CM | POA: Diagnosis not present

## 2020-12-13 DIAGNOSIS — Z369 Encounter for antenatal screening, unspecified: Secondary | ICD-10-CM | POA: Diagnosis not present

## 2020-12-13 DIAGNOSIS — Z3480 Encounter for supervision of other normal pregnancy, unspecified trimester: Secondary | ICD-10-CM | POA: Diagnosis not present

## 2020-12-18 DIAGNOSIS — Z3A36 36 weeks gestation of pregnancy: Secondary | ICD-10-CM | POA: Diagnosis not present

## 2020-12-18 DIAGNOSIS — O26843 Uterine size-date discrepancy, third trimester: Secondary | ICD-10-CM | POA: Diagnosis not present

## 2020-12-21 ENCOUNTER — Telehealth (HOSPITAL_COMMUNITY): Payer: Self-pay | Admitting: *Deleted

## 2020-12-21 NOTE — Patient Instructions (Signed)
Senaida Klingler Nevitt  12/21/2020   Your procedure is scheduled on:  01/04/2021  Arrive at 0900 at Entrance C on [address] at [facility]  and [employer]. You are invited to use the FREE valet parking or use the Visitor's parking deck.  Pick up the phone at the desk and dial [phone].  Call this number if you have problems the morning of surgery: [phone]  Remember:   Do not eat food:(After Midnight) Desps de medianoche.  Do not drink clear liquids: (After Midnight) Desps de medianoche.  Take these medicines the morning of surgery with A SIP OF WATER:  none   Do not wear jewelry, make-up or nail polish.  Do not wear lotions, powders, or perfumes. Do not wear deodorant.  Do not shave 48 hours prior to surgery.  Do not bring valuables to the hospital.  [facility] is not   responsible for any belongings or valuables brought to the hospital.  Contacts, dentures or bridgework may not be worn into surgery.  Leave suitcase in the car. After surgery it may be brought to your room.  For patients admitted to the hospital, checkout time is 11:00 AM the day of              discharge.      Please read over the following fact sheets that you were given:     Preparing for Surgery

## 2020-12-21 NOTE — Telephone Encounter (Signed)
Preadmission screen  

## 2020-12-25 ENCOUNTER — Encounter (HOSPITAL_COMMUNITY): Payer: Self-pay

## 2020-12-26 DIAGNOSIS — Z369 Encounter for antenatal screening, unspecified: Secondary | ICD-10-CM | POA: Diagnosis not present

## 2020-12-27 ENCOUNTER — Other Ambulatory Visit: Payer: BC Managed Care – PPO

## 2020-12-31 DIAGNOSIS — Z369 Encounter for antenatal screening, unspecified: Secondary | ICD-10-CM | POA: Diagnosis not present

## 2021-01-02 ENCOUNTER — Encounter (HOSPITAL_COMMUNITY)
Admission: RE | Admit: 2021-01-02 | Discharge: 2021-01-02 | Disposition: A | Discharge status: Home / Self Care | Payer: BC Managed Care – PPO | Source: Ambulatory Visit | Attending: Obstetrics and Gynecology | Admitting: Obstetrics and Gynecology

## 2021-01-02 ENCOUNTER — Other Ambulatory Visit: Payer: Self-pay

## 2021-01-02 DIAGNOSIS — O328XX0 Maternal care for other malpresentation of fetus, not applicable or unspecified: Secondary | ICD-10-CM | POA: Diagnosis not present

## 2021-01-02 DIAGNOSIS — Z3A39 39 weeks gestation of pregnancy: Secondary | ICD-10-CM | POA: Diagnosis not present

## 2021-01-02 DIAGNOSIS — Z01812 Encounter for preprocedural laboratory examination: Secondary | ICD-10-CM | POA: Insufficient documentation

## 2021-01-02 DIAGNOSIS — O34211 Maternal care for low transverse scar from previous cesarean delivery: Secondary | ICD-10-CM | POA: Diagnosis not present

## 2021-01-02 DIAGNOSIS — Z412 Encounter for routine and ritual male circumcision: Secondary | ICD-10-CM | POA: Diagnosis not present

## 2021-01-02 DIAGNOSIS — O321XX1 Maternal care for breech presentation, fetus 1: Secondary | ICD-10-CM | POA: Diagnosis not present

## 2021-01-02 DIAGNOSIS — O3413 Maternal care for benign tumor of corpus uteri, third trimester: Secondary | ICD-10-CM | POA: Diagnosis not present

## 2021-01-02 DIAGNOSIS — D252 Subserosal leiomyoma of uterus: Secondary | ICD-10-CM | POA: Diagnosis not present

## 2021-01-02 DIAGNOSIS — Z302 Encounter for sterilization: Secondary | ICD-10-CM | POA: Diagnosis not present

## 2021-01-02 DIAGNOSIS — Z23 Encounter for immunization: Secondary | ICD-10-CM | POA: Diagnosis not present

## 2021-01-02 DIAGNOSIS — O99214 Obesity complicating childbirth: Secondary | ICD-10-CM | POA: Diagnosis not present

## 2021-01-02 DIAGNOSIS — R94120 Abnormal auditory function study: Secondary | ICD-10-CM | POA: Diagnosis not present

## 2021-01-02 DIAGNOSIS — O34219 Maternal care for unspecified type scar from previous cesarean delivery: Secondary | ICD-10-CM | POA: Diagnosis not present

## 2021-01-02 LAB — CBC WITH DIFFERENTIAL/PLATELET
Abs Immature Granulocytes: 0.01 10*3/uL (ref 0.00–0.07)
Basophils Absolute: 0 10*3/uL (ref 0.0–0.1)
Basophils Relative: 0 %
Eosinophils Absolute: 0 10*3/uL (ref 0.0–0.5)
Eosinophils Relative: 1 %
HCT: 36.8 % (ref 36.0–46.0)
Hemoglobin: 12.3 g/dL (ref 12.0–15.0)
Immature Granulocytes: 0 %
Lymphocytes Relative: 28 %
Lymphs Abs: 1.7 10*3/uL (ref 0.7–4.0)
MCH: 31.7 pg (ref 26.0–34.0)
MCHC: 33.4 g/dL (ref 30.0–36.0)
MCV: 94.8 fL (ref 80.0–100.0)
Monocytes Absolute: 0.6 10*3/uL (ref 0.1–1.0)
Monocytes Relative: 9 %
Neutro Abs: 3.7 10*3/uL (ref 1.7–7.7)
Neutrophils Relative %: 62 %
Platelets: 231 10*3/uL (ref 150–400)
RBC: 3.88 MIL/uL (ref 3.87–5.11)
RDW: 13.7 % (ref 11.5–15.5)
WBC: 5.9 10*3/uL (ref 4.0–10.5)
nRBC: 0 % (ref 0.0–0.2)

## 2021-01-02 LAB — TYPE AND SCREEN
ABO/RH(D): O POS
Antibody Screen: NEGATIVE

## 2021-01-03 LAB — SARS CORONAVIRUS 2 (TAT 6-24 HRS): SARS Coronavirus 2: NEGATIVE

## 2021-01-03 LAB — RPR: RPR Ser Ql: NONREACTIVE

## 2021-01-04 ENCOUNTER — Other Ambulatory Visit: Payer: Self-pay

## 2021-01-04 ENCOUNTER — Encounter (HOSPITAL_COMMUNITY)
Admission: RE | Disposition: A | Discharge status: Home / Self Care | Payer: Self-pay | Source: Home / Self Care | Attending: Obstetrics and Gynecology

## 2021-01-04 ENCOUNTER — Inpatient Hospital Stay (HOSPITAL_COMMUNITY): Payer: BC Managed Care – PPO | Admitting: Certified Registered Nurse Anesthetist

## 2021-01-04 ENCOUNTER — Inpatient Hospital Stay (HOSPITAL_COMMUNITY)
Admission: RE | Admit: 2021-01-04 | Discharge: 2021-01-06 | DRG: 785 | Disposition: A | Discharge status: Home / Self Care | Payer: BC Managed Care – PPO | Attending: Obstetrics and Gynecology | Admitting: Obstetrics and Gynecology

## 2021-01-04 ENCOUNTER — Encounter (HOSPITAL_COMMUNITY): Payer: Self-pay | Admitting: Obstetrics and Gynecology

## 2021-01-04 DIAGNOSIS — O34211 Maternal care for low transverse scar from previous cesarean delivery: Principal | ICD-10-CM | POA: Diagnosis present

## 2021-01-04 DIAGNOSIS — D252 Subserosal leiomyoma of uterus: Secondary | ICD-10-CM | POA: Diagnosis present

## 2021-01-04 DIAGNOSIS — O328XX0 Maternal care for other malpresentation of fetus, not applicable or unspecified: Secondary | ICD-10-CM | POA: Diagnosis present

## 2021-01-04 DIAGNOSIS — Z302 Encounter for sterilization: Secondary | ICD-10-CM | POA: Diagnosis not present

## 2021-01-04 DIAGNOSIS — O99214 Obesity complicating childbirth: Secondary | ICD-10-CM | POA: Diagnosis present

## 2021-01-04 DIAGNOSIS — Z3A39 39 weeks gestation of pregnancy: Secondary | ICD-10-CM | POA: Diagnosis not present

## 2021-01-04 DIAGNOSIS — O3413 Maternal care for benign tumor of corpus uteri, third trimester: Secondary | ICD-10-CM | POA: Diagnosis present

## 2021-01-04 DIAGNOSIS — Z98891 History of uterine scar from previous surgery: Secondary | ICD-10-CM

## 2021-01-04 SURGERY — Surgical Case
Anesthesia: Spinal | Laterality: Bilateral

## 2021-01-04 MED ORDER — SODIUM CHLORIDE 0.9% FLUSH: 3.0000 mL | INTRAVENOUS | Status: DC | PRN

## 2021-01-04 MED ORDER — WITCH HAZEL-GLYCERIN EX PADS: 1.0000 "application " | MEDICATED_PAD | CUTANEOUS | Status: DC | PRN

## 2021-01-04 MED ORDER — SIMETHICONE 80 MG PO CHEW
80.0000 mg | CHEWABLE_TABLET | ORAL | Status: DC | PRN
  Administered 2021-01-05: 80 mg via ORAL
  Filled 2021-01-04 (×2): qty 1

## 2021-01-04 MED ORDER — KETOROLAC TROMETHAMINE 30 MG/ML IJ SOLN
30.0000 mg | Freq: Once | INTRAMUSCULAR | Status: AC | PRN
  Administered 2021-01-04: 30 mg via INTRAVENOUS

## 2021-01-04 MED ORDER — ONDANSETRON HCL 4 MG/2ML IJ SOLN
INTRAMUSCULAR | Status: AC
  Filled 2021-01-04: qty 2

## 2021-01-04 MED ORDER — NALBUPHINE HCL 10 MG/ML IJ SOLN: 5.0000 mg | INTRAMUSCULAR | Status: DC | PRN

## 2021-01-04 MED ORDER — MENTHOL 3 MG MT LOZG: 1.0000 | LOZENGE | OROMUCOSAL | Status: DC | PRN

## 2021-01-04 MED ORDER — OXYCODONE HCL 5 MG PO TABS
5.0000 mg | ORAL_TABLET | Freq: Once | ORAL | Status: DC | PRN
Start: 2021-01-04 — End: 2021-01-04

## 2021-01-04 MED ORDER — OXYCODONE HCL 5 MG PO TABS
5.0000 mg | ORAL_TABLET | ORAL | Status: DC | PRN
  Administered 2021-01-05 – 2021-01-06 (×4): 5 mg via ORAL
  Filled 2021-01-04 (×4): qty 1

## 2021-01-04 MED ORDER — SOD CITRATE-CITRIC ACID 500-334 MG/5ML PO SOLN
ORAL | Status: AC
  Filled 2021-01-04: qty 30

## 2021-01-04 MED ORDER — LACTATED RINGERS IV SOLN: INTRAVENOUS | Status: DC | PRN

## 2021-01-04 MED ORDER — SENNOSIDES-DOCUSATE SODIUM 8.6-50 MG PO TABS
2.0000 | ORAL_TABLET | ORAL | Status: DC
  Administered 2021-01-05 – 2021-01-06 (×2): 2 via ORAL
  Filled 2021-01-04 (×2): qty 2

## 2021-01-04 MED ORDER — SOD CITRATE-CITRIC ACID 500-334 MG/5ML PO SOLN
30.0000 mL | ORAL | Status: AC
  Administered 2021-01-04: 30 mL via ORAL

## 2021-01-04 MED ORDER — FLEET ENEMA 7-19 GM/118ML RE ENEM: 1.0000 | ENEMA | Freq: Every day | RECTAL | Status: DC | PRN

## 2021-01-04 MED ORDER — OXYTOCIN-SODIUM CHLORIDE 30-0.9 UT/500ML-% IV SOLN
INTRAVENOUS | Status: DC | PRN
  Administered 2021-01-04: 250 mL via INTRAVENOUS

## 2021-01-04 MED ORDER — BISACODYL 10 MG RE SUPP: 10.0000 mg | Freq: Every day | RECTAL | Status: DC | PRN

## 2021-01-04 MED ORDER — ACETAMINOPHEN 500 MG PO TABS
1000.0000 mg | ORAL_TABLET | Freq: Four times a day (QID) | ORAL | Status: DC
  Administered 2021-01-04 – 2021-01-06 (×9): 1000 mg via ORAL
  Filled 2021-01-04 (×9): qty 2

## 2021-01-04 MED ORDER — MEPERIDINE HCL 25 MG/ML IJ SOLN
INTRAMUSCULAR | Status: AC
  Filled 2021-01-04: qty 1

## 2021-01-04 MED ORDER — ZOLPIDEM TARTRATE 5 MG PO TABS: 5.0000 mg | ORAL_TABLET | Freq: Every evening | ORAL | Status: DC | PRN

## 2021-01-04 MED ORDER — LACTATED RINGERS IV SOLN: INTRAVENOUS | Status: DC

## 2021-01-04 MED ORDER — DIBUCAINE (PERIANAL) 1 % EX OINT: 1.0000 "application " | TOPICAL_OINTMENT | CUTANEOUS | Status: DC | PRN

## 2021-01-04 MED ORDER — FENTANYL CITRATE (PF) 100 MCG/2ML IJ SOLN
INTRAMUSCULAR | Status: AC
  Filled 2021-01-04: qty 2

## 2021-01-04 MED ORDER — PHENYLEPHRINE 40 MCG/ML (10ML) SYRINGE FOR IV PUSH (FOR BLOOD PRESSURE SUPPORT)
PREFILLED_SYRINGE | INTRAVENOUS | Status: DC | PRN
  Administered 2021-01-04: 80 ug via INTRAVENOUS

## 2021-01-04 MED ORDER — TETANUS-DIPHTH-ACELL PERTUSSIS 5-2.5-18.5 LF-MCG/0.5 IM SUSY: 0.5000 mL | PREFILLED_SYRINGE | Freq: Once | INTRAMUSCULAR | Status: DC

## 2021-01-04 MED ORDER — PRENATAL MULTIVITAMIN CH
1.0000 | ORAL_TABLET | Freq: Every day | ORAL | Status: DC
  Administered 2021-01-05 – 2021-01-06 (×2): 1 via ORAL
  Filled 2021-01-04 (×2): qty 1

## 2021-01-04 MED ORDER — CEFAZOLIN SODIUM-DEXTROSE 2-4 GM/100ML-% IV SOLN
2.0000 g | INTRAVENOUS | Status: AC
  Administered 2021-01-04: 2 g via INTRAVENOUS

## 2021-01-04 MED ORDER — MORPHINE SULFATE (PF) 0.5 MG/ML IJ SOLN
INTRAMUSCULAR | Status: AC
  Filled 2021-01-04: qty 10

## 2021-01-04 MED ORDER — CEFAZOLIN SODIUM-DEXTROSE 2-4 GM/100ML-% IV SOLN
INTRAVENOUS | Status: AC
  Filled 2021-01-04: qty 100

## 2021-01-04 MED ORDER — HYDROMORPHONE HCL 1 MG/ML IJ SOLN: 0.2500 mg | INTRAMUSCULAR | Status: DC | PRN

## 2021-01-04 MED ORDER — OXYTOCIN-SODIUM CHLORIDE 30-0.9 UT/500ML-% IV SOLN: 2.5000 [IU]/h | INTRAVENOUS | Status: AC

## 2021-01-04 MED ORDER — DIPHENHYDRAMINE HCL 25 MG PO CAPS: 25.0000 mg | ORAL_CAPSULE | Freq: Four times a day (QID) | ORAL | Status: DC | PRN

## 2021-01-04 MED ORDER — KETOROLAC TROMETHAMINE 30 MG/ML IJ SOLN
30.0000 mg | Freq: Four times a day (QID) | INTRAMUSCULAR | Status: AC
  Administered 2021-01-04 – 2021-01-05 (×2): 30 mg via INTRAVENOUS
  Filled 2021-01-04 (×2): qty 1

## 2021-01-04 MED ORDER — DEXAMETHASONE SODIUM PHOSPHATE 10 MG/ML IJ SOLN
INTRAMUSCULAR | Status: DC | PRN
  Administered 2021-01-04: 8 mg via INTRAVENOUS

## 2021-01-04 MED ORDER — FENTANYL CITRATE (PF) 100 MCG/2ML IJ SOLN
INTRAMUSCULAR | Status: DC | PRN
  Administered 2021-01-04: 15 ug via INTRATHECAL

## 2021-01-04 MED ORDER — NALBUPHINE HCL 10 MG/ML IJ SOLN: 5.0000 mg | Freq: Once | INTRAMUSCULAR | Status: DC | PRN

## 2021-01-04 MED ORDER — KETOROLAC TROMETHAMINE 30 MG/ML IJ SOLN
INTRAMUSCULAR | Status: AC
  Filled 2021-01-04: qty 1

## 2021-01-04 MED ORDER — PHENYLEPHRINE HCL-NACL 20-0.9 MG/250ML-% IV SOLN
INTRAVENOUS | Status: DC | PRN
  Administered 2021-01-04: 10 ug/min via INTRAVENOUS

## 2021-01-04 MED ORDER — MEPERIDINE HCL 25 MG/ML IJ SOLN
INTRAMUSCULAR | Status: DC | PRN
  Administered 2021-01-04: 12.5 mg via INTRAVENOUS

## 2021-01-04 MED ORDER — HYDROMORPHONE HCL 1 MG/ML IJ SOLN: 0.2000 mg | INTRAMUSCULAR | Status: DC | PRN

## 2021-01-04 MED ORDER — MORPHINE SULFATE (PF) 0.5 MG/ML IJ SOLN
INTRAMUSCULAR | Status: DC | PRN
  Administered 2021-01-04: 150 ug via INTRATHECAL

## 2021-01-04 MED ORDER — OXYCODONE HCL 5 MG/5ML PO SOLN: 5.0000 mg | Freq: Once | ORAL | Status: DC | PRN

## 2021-01-04 MED ORDER — BUPIVACAINE IN DEXTROSE 0.75-8.25 % IT SOLN
INTRATHECAL | Status: DC | PRN
  Administered 2021-01-04: 1.6 mL via INTRATHECAL

## 2021-01-04 MED ORDER — MEPERIDINE HCL 25 MG/ML IJ SOLN: 6.2500 mg | INTRAMUSCULAR | Status: DC | PRN

## 2021-01-04 MED ORDER — COCONUT OIL OIL: 1.0000 "application " | TOPICAL_OIL | Status: DC | PRN

## 2021-01-04 MED ORDER — DIPHENHYDRAMINE HCL 25 MG PO CAPS: 25.0000 mg | ORAL_CAPSULE | ORAL | Status: DC | PRN

## 2021-01-04 MED ORDER — IBUPROFEN 600 MG PO TABS
600.0000 mg | ORAL_TABLET | Freq: Four times a day (QID) | ORAL | Status: DC
  Administered 2021-01-05 – 2021-01-06 (×5): 600 mg via ORAL
  Filled 2021-01-04 (×6): qty 1

## 2021-01-04 MED ORDER — STERILE WATER FOR IRRIGATION IR SOLN
Status: DC | PRN
  Administered 2021-01-04: 1000 mL

## 2021-01-04 MED ORDER — NALOXONE HCL 0.4 MG/ML IJ SOLN: 0.4000 mg | INTRAMUSCULAR | Status: DC | PRN

## 2021-01-04 MED ORDER — DIPHENHYDRAMINE HCL 50 MG/ML IJ SOLN: 12.5000 mg | INTRAMUSCULAR | Status: DC | PRN

## 2021-01-04 MED ORDER — PROMETHAZINE HCL 25 MG/ML IJ SOLN: 6.2500 mg | INTRAMUSCULAR | Status: DC | PRN

## 2021-01-04 MED ORDER — ONDANSETRON HCL 4 MG/2ML IJ SOLN
INTRAMUSCULAR | Status: DC | PRN
  Administered 2021-01-04: 4 mg via INTRAVENOUS

## 2021-01-04 MED ORDER — NALOXONE HCL 4 MG/10ML IJ SOLN
1.0000 ug/kg/h | INTRAVENOUS | Status: DC | PRN
  Filled 2021-01-04: qty 5

## 2021-01-04 MED ORDER — SODIUM CHLORIDE 0.9 % IR SOLN
Status: DC | PRN
  Administered 2021-01-04: 1

## 2021-01-04 SURGICAL SUPPLY — 35 items
BENZOIN TINCTURE PRP APPL 2/3 (GAUZE/BANDAGES/DRESSINGS) ×2 IMPLANT
CLAMP CORD UMBIL (MISCELLANEOUS) IMPLANT
CLIP FILSHIE TUBAL LIGA STRL (Clip) ×1 IMPLANT
CLOTH BEACON ORANGE TIMEOUT ST (SAFETY) ×2 IMPLANT
DRSG OPSITE POSTOP 4X10 (GAUZE/BANDAGES/DRESSINGS) ×2 IMPLANT
ELECT REM PT RETURN 9FT ADLT (ELECTROSURGICAL) ×2
ELECTRODE REM PT RTRN 9FT ADLT (ELECTROSURGICAL) ×1 IMPLANT
EXTRACTOR VACUUM BELL STYLE (SUCTIONS) IMPLANT
GAUZE SPONGE 4X4 12PLY STRL LF (GAUZE/BANDAGES/DRESSINGS) ×4 IMPLANT
GLOVE BIOGEL PI IND STRL 7.0 (GLOVE) ×2 IMPLANT
GLOVE BIOGEL PI INDICATOR 7.0 (GLOVE) ×2
GLOVE SURG LTX SZ6 (GLOVE) ×2 IMPLANT
GLOVE SURG UNDER POLY LF SZ6.5 (GLOVE) ×2 IMPLANT
GOWN STRL REUS W/TWL LRG LVL3 (GOWN DISPOSABLE) ×4 IMPLANT
KIT ABG SYR 3ML LUER SLIP (SYRINGE) IMPLANT
LIGASURE IMPACT 36 18CM CVD LR (INSTRUMENTS) ×2 IMPLANT
NEEDLE HYPO 25X5/8 SAFETYGLIDE (NEEDLE) IMPLANT
NS IRRIG 1000ML POUR BTL (IV SOLUTION) ×2 IMPLANT
PACK C SECTION WH (CUSTOM PROCEDURE TRAY) ×2 IMPLANT
PAD ABD 7.5X8 STRL (GAUZE/BANDAGES/DRESSINGS) ×2 IMPLANT
PAD OB MATERNITY 4.3X12.25 (PERSONAL CARE ITEMS) ×2 IMPLANT
PENCIL SMOKE EVAC W/HOLSTER (ELECTROSURGICAL) ×2 IMPLANT
RETRACTOR TRAXI PANNICULUS (MISCELLANEOUS) ×1 IMPLANT
RTRCTR C-SECT PINK 25CM LRG (MISCELLANEOUS) ×2 IMPLANT
STRIP CLOSURE SKIN 1/2X4 (GAUZE/BANDAGES/DRESSINGS) ×2 IMPLANT
SUT MNCRL 0 VIOLET CTX 36 (SUTURE) ×2 IMPLANT
SUT MONOCRYL 0 CTX 36 (SUTURE) ×4
SUT VIC AB 0 CT1 36 (SUTURE) ×4 IMPLANT
SUT VIC AB 3-0 CT1 27 (SUTURE)
SUT VIC AB 3-0 CT1 TAPERPNT 27 (SUTURE) IMPLANT
SUT VIC AB 4-0 KS 27 (SUTURE) ×2 IMPLANT
TOWEL OR 17X24 6PK STRL BLUE (TOWEL DISPOSABLE) ×2 IMPLANT
TRAXI PANNICULUS RETRACTOR (MISCELLANEOUS) ×1
TRAY FOLEY W/BAG SLVR 14FR LF (SET/KITS/TRAYS/PACK) ×2 IMPLANT
WATER STERILE IRR 1000ML POUR (IV SOLUTION) ×2 IMPLANT

## 2021-01-04 NOTE — Op Note (Addendum)
CESAREAN SECTION Procedure Note  Patient: Jacqueline Davidson is a 35 y.o. [id number] @ [redacted]w[redacted]d Preoperative Diagnosis:  1. Intrauterine pregnancy at 39 weeks 0 days 2. Breech presentation 3. History of cesarean 4. Desire for permanent sterilization  Postoperative Diagnosis:  1. Intrauterine pregnancy at 39 weeks 0 days - delivered 2. Breech presentation 3. History of cesarean 4. Desire for permanent sterilization  Procedure: Repeat low transverse cesarean and bilateral salpingectomy     Surgeon: MRowland Lathe, MD  Assistant: KVanessa Kick MD  Anesthesia: Spinal anesthesia   Findings: Small 3cm left uterine subserosal fibroid. Otherwise normal appearing uterus, fallopian tubes bilaterally, and ovaries bilaterally.  Viable female infant in footling breech presentation delivered at 164with weight 9lb 6.1oz.  Apgars 9 and 9.  Estimated Blood Loss:  9968m        Specimens: Placenta to L&D for disposal. Bilateral fallopian tubes to pathology.          Complications:  None         Disposition: PACU - hemodynamically stable.         Condition: stable    Description of Procedure: The patient was taken to the operating room where spinal anesthesia was placed and found to be adequate.  The patient was placed in the dorsal supine position.  Fetal heart tones were confirmed. Thromboguards were applied and cycling. A foley catheter was inserted and draining. Ancef 2g was given for infection prophylaxis. A Traxi pannus retractor was applied. The patient was subsequently prepped and draped in the normal sterile fashion.    A low transverse skin incision was made with a scalpel and carried down to the level of the fascia.  The fascia was incised in the midline with the scalpel and extended laterally with curved Mayo scissors.  Kocher clamps were applied to the inferior fascial edge and the fascia was dissected off the rectus muscle sharply using the Mayo scissors.  The Kocher clamps were  transferred to the superior fascial edge and the underlying rectus muscle was dissected off with curved Mayo's scissors.  The rectus muscles then were separated in the midline.  The peritoneum was found free of adherent bowel and the peritoneal cavity was entered bluntly.  The uterus was identified and the alexis retractor was placed intraperitoneal.  A bladder flap was then created sharply with Metzenbaum scissors and separated from the lower uterine segment digitally.   A low transverse hysterotomy was then made with a scalpel.  The infant was found in the double footling breech presentation was delivered atraumatically and without difficulty.  After 60 seconds of delayed cord clamping the cord was clamped and cut and the infant was handed off to the pediatricians.  The placenta was delivered with gentle traction on umbilical cord and manual massage of the uterine fundus.  The uterus was cleared of all clot and debris.  The hysterotomy was then closed with 0 monocryl in a running locked fashion,  followed by 0 Monocryl in an imbricating fashion.  The hysterotomy was found to be hemostatic. Bilateral salpingectomy was performed by identifying each fallopian tube and serially ligating the mesosalpinx with the Ligasure. The fallopian tubes were removed and sent for pathology. The fascia was closed with a 0 Vicryl suture in a continuous running fashion.  The subcutaneous tissue was irrigated and rendered hemostatic with cautery.  The subcutaneous layer was subsequently closed with 3-0 Vicryl in a continuous running fashion.  The skin was closed with 4-0 vicryl  in a running subcuticular fashion.  Sponge, lap and needle counts were correct. Steri strips and a Honeycomb dressing were placed on the incision and a pressure dressing was applied  Rowland Lathe 01/04/21  12:22 PM

## 2021-01-04 NOTE — Anesthesia Postprocedure Evaluation (Signed)
Anesthesia Post Note  Patient: Jacqueline Davidson  Procedure(s) Performed: CESAREAN SECTION WITH BILATERAL SALPINGECTOMY (Bilateral)     Patient location during evaluation: PACU Anesthesia Type: Spinal Level of consciousness: awake and alert Pain management: pain level controlled Vital Signs Assessment: post-procedure vital signs reviewed and stable Respiratory status: spontaneous breathing, nonlabored ventilation and respiratory function stable Cardiovascular status: blood pressure returned to baseline and stable Postop Assessment: no apparent nausea or vomiting Anesthetic complications: no   No notable events documented.  Last Vitals:  Vitals:   01/04/21 1330 01/04/21 1342  BP: 110/78 105/73  Pulse: 68 (!) 51  Resp: 18 18  Temp:  36.9 C  SpO2: 96% 100%    Last Pain:  Vitals:   01/04/21 1400  TempSrc:   PainSc: 0-No pain   Pain Goal:                Epidural/Spinal Function Cutaneous sensation: Normal sensation (01/04/21 1400), Patient able to flex knees: Yes (01/04/21 1400), Patient able to lift hips off bed: Yes (01/04/21 1400), Back pain beyond tenderness at insertion site: No (01/04/21 1400), Progressively worsening motor and/or sensory loss: No (01/04/21 1400), Bowel and/or bladder incontinence post epidural: No (01/04/21 1400)  Lynda Rainwater

## 2021-01-04 NOTE — Progress Notes (Signed)
attempted to get patient up for orthostatics-pt. declined at this time- wanting to continue STS with baby

## 2021-01-04 NOTE — Anesthesia Preprocedure Evaluation (Signed)
Anesthesia Evaluation  Patient identified by MRN, date of birth, ID band Patient awake    Reviewed: Allergy & Precautions, NPO status , Patient's Chart, lab work & pertinent test resultsPreop documentation limited or incomplete due to emergent nature of procedure.  History of Anesthesia Complications Negative for: history of anesthetic complications  Airway Mallampati: II  TM Distance: >3 FB Neck ROM: full    Dental no notable dental hx.    Pulmonary neg pulmonary ROS,    Pulmonary exam normal        Cardiovascular negative cardio ROS   Rhythm:regular Rate:Tachycardia     Neuro/Psych Anxiety negative neurological ROS  negative psych ROS   GI/Hepatic Neg liver ROS, GERD  ,  Endo/Other  negative endocrine ROS  Renal/GU negative Renal ROS  negative genitourinary   Musculoskeletal negative musculoskeletal ROS (+)   Abdominal (+) + obese,   Peds negative pediatric ROS (+)  Hematology negative hematology ROS (+)   Anesthesia Other Findings   Reproductive/Obstetrics negative OB ROS (+) Pregnancy                             Anesthesia Physical  Anesthesia Plan  ASA: II  Anesthesia Plan: Spinal   Post-op Pain Management:    Induction: Intravenous  PONV Risk Score and Plan: 4 or greater and 2 and Ondansetron, Treatment may vary due to age or medical condition, Dexamethasone and Midazolam  Airway Management Planned: Natural Airway  Additional Equipment: None  Intra-op Plan:   Post-operative Plan:   Informed Consent:   Plan Discussed with:   Anesthesia Plan Comments:         Anesthesia Quick Evaluation

## 2021-01-04 NOTE — H&P (Signed)
Jacqueline Davidson is a 35 y.o. female [id number] for scheduled repeat cesarean and bilateral salpingectomy. She reports noLOF, VB, Contractions. Normal FM.   Pregnancy c/b: History of cesarean for breech presentation (G3) Obesity: starting pregnancy BMI 31.6  OB History     Gravida  3   Para  2   Term  2   Preterm      AB      Living  2      SAB      IAB      Ectopic      Multiple  0   Live Births  2          Past Medical History:  Diagnosis Date   Breast mass, left 03/04/2011   Patient was scheduled for surgery on 05-09-2011.  Patient called to cancel due to upcoming marriage.  Will call back to reschedule in future. 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  Vaginal delivery 10/10/2015   Past Surgical History:  Procedure Laterality Date   CESAREAN SECTION N/A 07/27/2018   Procedure: CESAREAN SECTION;  Surgeon: STruett Mainland DO;  Location: [facility] LD ORS;  Service: Obstetrics;  Laterality: N/A;   left breast lumpectomy  06/03/2011   no evidence of malignancy   TYMPANOSTOMY TUBE PLACEMENT  35years old   WISDOM TOOTH EXTRACTION     Family History: family history includes Diabetes in her maternal grandfather, paternal grandmother, and sister; Early death in her father; Esophageal cancer in her father; GBerenice Primas disease in her sister; Lupus in her cousin and maternal aunt; Other in her maternal grandmother; Scleroderma in her maternal grandfather. Social History:  reports that she has never smoked. She has never used smokeless tobacco. She reports current alcohol use. She reports that she does not use drugs.     Maternal Diabetes: No Genetic Screening: Declined Maternal Ultrasounds/Referrals: Normal Fetal Ultrasounds or other Referrals:  Other:  7/19 EFW: vtx 3073 gm (6#12) 58%, AFI 10.2 cm, Maternal Substance Abuse:  No Significant Maternal  Medications:  None Significant Maternal Lab Results:  Group B Strep negative Other Comments:  None  Review of Systems Per HPI Exam Physical Exam    Blood pressure 118/77, pulse 88, temperature 98.2 F (36.8 C), temperature source Oral, resp. rate 17, height '5\' 2"'$  (1.575 m), weight 93 kg, last menstrual period 04/06/2020. Gen: NAD, resting comfortably CVS: RRR Lungs: CTAB Abd: Gravid abdomen Ext:no calf edema or tenderness  Bedside TAUS: complete breech Fetal testing: HR 149 Prenatal labs: ABO, Rh:  --/--/O POS (08/03 [id number] Antibody: NEG (08/03 0943) Rubella: Nonimmune (02/11 0000) RPR: NON REACTIVE (08/03 0940)  HBsAg: Negative (02/11 0000)  HIV: Non-reactive (02/11 0000)  GBS:   negative  Assessment/Plan: 34Y [id number]@ 39.0, repeat cesarean and bilateral salpingectomy for history of cesarean. Noted to be breech on admission.  - Risks/benefits of cesarean reviewed. Reviewed risk of infection, bleeding, blood transfusion, hysterectomy, damage to surrounding organs. Patient and husband state understanding and wish to proceed. All questions answered.  - Desires permanent sterilization: plan for bilateral salpingectomy - Ancef 2g  MRowland Lathe8/09/2020, 10:48 AM

## 2021-01-04 NOTE — Transfer of Care (Signed)
Immediate Anesthesia Transfer of Care Note  Patient: Jacqueline Davidson  Procedure(s) Performed: CESAREAN SECTION WITH BILATERAL SALPINGECTOMY (Bilateral)  Patient Location: PACU  Anesthesia Type:Spinal  Level of Consciousness: awake  Airway & Oxygen Therapy: Patient Spontanous Breathing  Post-op Assessment: Report given to RN  Post vital signs: Reviewed  Last Vitals:  Vitals Value Taken Time  BP 102/75 01/04/21 1234  Temp    Pulse 81 01/04/21 1235  Resp 19 01/04/21 1235  SpO2 98 % 01/04/21 1235  Vitals shown include unvalidated device data.  Last Pain:  Vitals:   01/04/21 0929  TempSrc: Oral  PainSc:          Complications: No notable events documented.

## 2021-01-04 NOTE — Lactation Note (Signed)
This note was copied from a baby's chart. Lactation Consultation Note  Patient Name: Jacqueline Davidson [id number] Date: 01/04/2021 Reason for consult: Initial assessment;1st time breastfeeding;Term Age:35 hours  P3 mother whose infant is now 55 hours old.  This is a term baby at 39+0 weeks.  Mother did not breast feed her first two children (now 5 and 62 years old).  Baby asleep in grandmother's arms when I arrived., however grandmother noticed that he was beginning to show feeding cues.  Mother provided 15 mls of formula supplementation approximately 1 1/2 hours ago.  Offered to try to awaken baby and assist with latching.  Taught mother hand expression; no drops observed at this time.  Assisted baby to latch to the right breast in the cross cradle hold.  With stimulation he began to eagerly suck and mother denied pain with feeding.  Baby had a wide gape and flanged lips.  Reviewed breast feeding basics while baby fed.  Mother will continue to feed on cue and call for latch assistance as needed.  Mother has very short shafted nipples; provided breast shells and a manual pump with instructions for use.  Offered coconut oil but mother politely declined.  Discussed how to obtain a deep latch for effective feeding.  Mother is planning on purchasing a DEBP; her insurance company did not cover the wireless pump she desires.  Grandmother supportive.     Maternal Data Has patient been taught Hand Expression?: Yes Does the patient have breastfeeding experience prior to this delivery?: No  Feeding Mother's Current Feeding Choice: Breast Milk and Formula  LATCH Score Latch: Repeated attempts needed to sustain latch, nipple held in mouth throughout feeding, stimulation needed to elicit sucking reflex.  Audible Swallowing: A few with stimulation  Type of Nipple: Everted at rest and after stimulation (short shafted)  Comfort (Breast/Nipple): Soft / non-tender  Hold (Positioning): Assistance needed to  correctly position infant at breast and maintain latch.  LATCH Score: 7   Lactation Tools Discussed/Used    Interventions Interventions: Breast feeding basics reviewed;Assisted with latch;Breast massage;Skin to skin;Hand express;Pre-pump if needed;Breast compression;Adjust position;Hand pump;Shells;Position options;Support pillows;Education  Discharge Pump: Manual;Personal  Consult Status Consult Status: Follow-up Date: 01/05/21 Follow-up type: In-patient    Nupur Hohman R Todrick Siedschlag 01/04/2021, 5:01 PM

## 2021-01-04 NOTE — Anesthesia Procedure Notes (Signed)
Spinal  Patient location during procedure: OB Start time: 01/04/2021 11:02 AM End time: 01/04/2021 11:07 AM Reason for block: surgical anesthesia Staffing Performed: anesthesiologist  Anesthesiologist: Lynda Rainwater, MD Preanesthetic Checklist Completed: patient identified, IV checked, risks and benefits discussed, surgical consent, monitors and equipment checked, pre-op evaluation and timeout performed Spinal Block Patient position: sitting Prep: DuraPrep and site prepped and draped Patient monitoring: heart rate, cardiac monitor, continuous pulse ox and blood pressure Approach: midline Location: L3-4 Injection technique: single-shot Needle Needle type: Pencan  Needle gauge: 24 G Needle length: 10 cm Assessment Sensory level: T4 Events: CSF return

## 2021-01-05 LAB — CBC
HCT: 27.2 % — ABNORMAL LOW (ref 36.0–46.0)
Hemoglobin: 9.3 g/dL — ABNORMAL LOW (ref 12.0–15.0)
MCH: 32.6 pg (ref 26.0–34.0)
MCHC: 34.2 g/dL (ref 30.0–36.0)
MCV: 95.4 fL (ref 80.0–100.0)
Platelets: 181 10*3/uL (ref 150–400)
RBC: 2.85 MIL/uL — ABNORMAL LOW (ref 3.87–5.11)
RDW: 13.5 % (ref 11.5–15.5)
WBC: 8.4 10*3/uL (ref 4.0–10.5)
nRBC: 0 % (ref 0.0–0.2)

## 2021-01-05 MED ORDER — FAMOTIDINE 20 MG PO TABS
20.0000 mg | ORAL_TABLET | Freq: Two times a day (BID) | ORAL | Status: DC | PRN
  Administered 2021-01-05: 20 mg via ORAL
  Filled 2021-01-05 (×2): qty 1

## 2021-01-05 MED ORDER — CALCIUM CARBONATE ANTACID 500 MG PO CHEW
1.0000 | CHEWABLE_TABLET | Freq: Three times a day (TID) | ORAL | Status: DC
  Administered 2021-01-05: 200 mg via ORAL
  Filled 2021-01-05 (×4): qty 1

## 2021-01-05 NOTE — Progress Notes (Signed)
Subjective: Postpartum Day 1: Cesarean Delivery Patient reports pain controlled, no nausea or vomiting, tolerating po  Objective: Vital signs in last 24 hours: Temp:  [97.9 F (36.6 C)-98.6 F (37 C)] 98.1 F (36.7 C) (08/06 0515) Pulse Rate:  [51-83] 67 (08/06 0515) Resp:  [13-19] 19 (08/06 0515) BP: (93-113)/(61-82) 93/61 (08/06 0515) SpO2:  [96 %-100 %] 99 % (08/06 0515)  Physical Exam:  General: alert, cooperative, and appears stated age 35: appropriate Uterine Fundus: firm Incision: pressure dressing C/D/I DVT Evaluation: No evidence of DVT seen on physical exam.  Recent Labs    01/05/21 0440  HGB 9.3*  HCT 27.2*    Assessment/Plan: Status post Cesarean section. Doing well postoperatively.  Continue current care. Desires neonatal circumcision, R/B/A of procedure discussed at length. Pt understands that neonatal circumcision is not considered medically necessary and is elective. The risks include, but are not limited to bleeding, infection, damage to the penis, development of scar tissue, and having to have it redone at a later date. Pt understands theses risks and wishes to proceed   Vanessa Kick 01/05/2021, 10:48 AM

## 2021-01-05 NOTE — Social Work (Signed)
CSW received consult for hx of Anxiety.  CSW met with MOB to offer support and complete assessment.     CSW introduced self and role. CSW observed MOB holding infant 'Jacqueline Davidson' and FOB Jacqueline Davidson present bedside. MOB declined to speak in private and stated FOB could remain in room for assessment. MOB was pleasant and receptive to visit. CSW informed MOB of reason for consult and assessed current emotions. MOB reported she is doing well and the pregnancy was okay. MOB stated she was diagnosed with anxiety a couple of years ago. MOB shared she experienced some symptoms during pregnancy, which she attributed to worrying about normal life things. CSW asked MOB if she has coping skills that help manage symptoms. MOB reported talking to family is helpful. MOB stated she has never been on medication or to therapy to treat, however she was receptive to resources provided. MOB identified her postpartum support system as being strong, consisting of FOB, her mother and sister. MOB denies any current SI or HI.  CSW provided education regarding the baby blues period versus PPD and provided resources. CSW provided the New Mom Checklist and encouraged MOB to self evaluate and contact a medical professional if symptoms are noted at any time.   CSW provided review of Sudden Infant Death Syndrome (SIDS) precautions. MOB reported she has all infant essentials including a bassinet and car seat. MOB identified [facility] for follow-up care and denies any transportation barriers. MOB stated she has no additional needs at this time.   CSW identifies no further need for intervention and no barriers to discharge at this time.  Darra Lis, [employer] [facility] [employer] [phone]

## 2021-01-05 NOTE — Lactation Note (Signed)
This note was copied from a baby's chart. Lactation Consultation Note  Patient Name: Jacqueline Davidson [id number] Date: 01/05/2021   Age:35 hours   LC Note:  Per RN, mother does not desire a lactation consult until later this evening.  Mother did not breast feed her first two children and has not attempted to latch since my visit yesterday with her.  Per RN, she still would like to breast feed.  Evening shift LC to follow up.   Maternal Data    Feeding Nipple Type: Extra Slow Flow  LATCH Score                    Lactation Tools Discussed/Used    Interventions    Discharge    Consult Status      Shyanna Klingel R Kharter Sestak 01/05/2021, 2:44 PM

## 2021-01-05 NOTE — Lactation Note (Signed)
This note was copied from a baby's chart. Lactation Consultation Note  Patient Name: Jacqueline Davidson [id number] Date: 01/05/2021 Reason for consult: Follow-up assessment;Mother's request;Difficult latch;Term;Hyperbilirubinemia (-3% weight loss, infant with hx emesis been very spitty, starting to resolve.) Age:35 hours Mom's feeding choice is breast and formula feeding. Mom was open to latching infant at the breast,per mom, infant was given 10 mls of formula at 2100 pm.  Per mom, this will be her 2nd time attempting to latch infant at the breast. Mom pre-pump breast with hand pump prior to latching infant, infant latched on  mom's her left breast using the football position and sustained latch, breastfeeding for 7 minutes. Afterwards infant took 5 mls of formula from slow flow bottle nipple, their was no emesis with this feeding. Mom declined using DEBP at this time , mom prefers to continue to use her hand pump, she has been finger feeding infant drops of colostrum that is expressed when using pump. Mom will continue with breastfeeding and supplementing infant with formula according to feeding cues, 8 to 12+ or more times within 24 hours. After feeding parents placed infant back under billi lights. Parents  Mom's plan: 1- To help establish milk supply going forward mom will attempt to latch infant every feeding and ask for latch assistance if needed. 2- On day 2 of life mom will try increase EBM/ formula intake if infant latches to 7-15 mls per feeding ,if infant doesn't latch 15-30 mls per feeding with pace bottle feeding.  Maternal Data    Feeding Mother's Current Feeding Choice: Breast Milk and Formula  LATCH Score Latch: Grasps breast easily, tongue down, lips flanged, rhythmical sucking.  Audible Swallowing: A few with stimulation  Type of Nipple: Flat  Comfort (Breast/Nipple): Soft / non-tender  Hold (Positioning): Assistance needed to correctly position infant at breast and  maintain latch.  LATCH Score: 7   Lactation Tools Discussed/Used    Interventions Interventions: Assisted with latch;Skin to skin;Pre-pump if needed;Breast compression;Adjust position;Support pillows;Position options;Hand pump;Education  Discharge Pump: Manual  Consult Status Consult Status: Follow-up Date: 01/06/21 Follow-up type: In-patient    Vicente Serene 01/05/2021, 10:41 PM

## 2021-01-06 MED ORDER — OXYCODONE-ACETAMINOPHEN 5-325 MG PO TABS: 1.0000 | ORAL_TABLET | Freq: Four times a day (QID) | ORAL | 0 refills | Status: DC | PRN

## 2021-01-06 NOTE — Lactation Note (Signed)
This note was copied from a baby's chart. Lactation Consultation Note  Patient Name: Jacqueline Davidson [id number] Date: 01/06/2021 Reason for consult: Follow-up assessment Age:35 hours  LC in to room for follow up. Mother reports "Jacqueline Davidson" has been sleepy after circumcision. Discussed normal newborn behavior and patterns after 24 HOL, clusterfeeding, expected voids and stools.  Mother states she ordered a wireless breast pump (mom cozy). Talked about the importance of breast stimulation and milk coming into volume.  Contact LC as needed for feeds/support/concerns/questions. Reviewed INJoy booklet. All questions answered at this time.   Feeding Mother's Current Feeding Choice: Breast Milk and Formula Nipple Type: Extra Slow Flow  Lactation Tools Discussed/Used Tools: Pump;Flanges Flange Size: 24 Breast pump type: Manual Pump Education: Milk Storage Reason for Pumping: nipple eversion Pumping frequency: prior to breastfeeding Pumped volume:  (drops, per mother)  Interventions Interventions: Breast feeding basics reviewed;Skin to skin;Expressed milk;Hand pump;Education  Discharge Discharge Education: Engorgement and breast care;Warning signs for feeding baby Pump: Manual;Personal  Consult Status Consult Status: Follow-up Date: 01/07/21 Follow-up type: In-patient    Zoa Dowty A Higuera Ancidey 01/06/2021, 3:56 PM

## 2021-01-06 NOTE — Discharge Summary (Signed)
Postpartum Discharge Summary       Patient Name: Jacqueline Davidson DOB: 1985-10-20 MRN: [id number]  Date of admission: 01/04/2021 Delivery date:01/04/2021  Delivering provider: Irene Pap E  Date of discharge: 01/06/2021  Admitting diagnosis: History of cesarean delivery [Z98.891] Intrauterine pregnancy: [redacted]w[redacted]d    Secondary diagnosis:  Active Problems:   History of cesarean delivery     Discharge diagnosis: Term Pregnancy Delivered                                              Post partum procedures: NA Complications: None  Hospital course: Sceduled C/S   35y.o. yo [id number] admitted to the hospital 01/04/2021 for scheduled cesarean section with the following indication:Elective Repeat.Delivery details are as follows:  Membrane Rupture Time/Date: 11:33 AM ,01/04/2021   Delivery Method:C-Section, Low Transverse  Details of operation can be found in separate operative note.  Patient had an uncomplicated postpartum course.  She is ambulating, tolerating a regular diet, passing flatus, and urinating well. Patient is discharged home in stable condition on  01/06/21        Newborn Data: Birth date:01/04/2021  Birth time:11:34 AM  Gender:Female  Living status:Living  Apgars:9 ,9  Weight:4255 g       Physical exam  Vitals:   01/05/21 0515 01/05/21 1736 01/05/21 2045 01/06/21 0510  BP: 93/61 105/68 115/72 116/72  Pulse: 67 75 79 79  Resp: '19 20 18 17  '$ Temp: 98.1 F (36.7 C) 97.8 F (36.6 C) 97.7 F (36.5 C) 98 F (36.7 C)  TempSrc: Oral Oral Oral Oral  SpO2: 99% 100% 100% 99%  Weight:      Height:       General: alert, cooperative, and no distress Lochia: appropriate Uterine Fundus: firm Incision: Healing well with no significant drainage DVT Evaluation: No evidence of DVT seen on physical exam. Labs: Lab Results  Component Value Date   WBC 8.4 01/05/2021   HGB 9.3 (L) 01/05/2021   HCT 27.2 (L) 01/05/2021   MCV 95.4 01/05/2021   PLT 181  01/05/2021   CMP Latest Ref Rng & Units 02/01/2020  Glucose 70 - 99 mg/dL 92  BUN 6 - 23 mg/dL 13  Creatinine 0.40 - 1.20 mg/dL 0.67  Sodium 135 - 145 mEq/L 138  Potassium 3.5 - 5.1 mEq/L 4.3  Chloride 96 - 112 mEq/L 104  CO2 19 - 32 mEq/L 28  Calcium 8.4 - 10.5 mg/dL 9.8  Total Protein 6.0 - 8.3 g/dL 6.9  Total Bilirubin 0.2 - 1.2 mg/dL 0.6  Alkaline Phos 39 - 117 U/L 47  AST 0 - 37 U/L 10  ALT 0 - 35 U/L 9   Edinburgh Score: Edinburgh Postnatal Depression Scale Screening Tool 01/05/2021  I have been able to laugh and see the funny side of things. 0  I have looked forward with enjoyment to things. 0  I have blamed myself unnecessarily when things went wrong. 1  I have been anxious or worried for no good reason. 3  I have felt scared or panicky for no good reason. 2  Things have been getting on top of me. 1  I have been so unhappy that I have had difficulty sleeping. 0  I have felt sad or miserable. 2  I have been so unhappy that I have been crying. 1  The thought of harming myself has occurred to me. 0  Edinburgh Postnatal Depression Scale Total 10        Discharge home in stable condition Infant Feeding: Bottle Infant Disposition:home with mother Discharge instruction: per After Visit Summary and Postpartum booklet. Activity: Advance as tolerated. Pelvic rest for 6 weeks.  Diet: routine diet Anticipated Birth Control: BTL done PP Postpartum Appointment:2 weeks Additional Postpartum F/U:  Future Appointments: Future Appointments  Date Time Provider [place]  02/19/2021 10:30 AM [facility] [place] 1 [facility]   Follow up Visit:  Follow-up Information     Rowland Lathe, MD Follow up in 1 week(s).   Specialty: Obstetrics and Gynecology Why: Incision check Contact information: [address] [place] [address] [phone]         Rowland Lathe, MD Follow up in 2 week(s).   Specialty: Obstetrics and  Gynecology Why: for a postpartum incision check Contact information: [place] [address] [phone]                     01/06/2021 Vanessa Kick, MD

## 2021-01-06 NOTE — Progress Notes (Signed)
CSW acknowledged consult for edinburgh score 10. MOB seen on 01/05/2021 by CSW Murvin Natal) and was provided education regarding the baby blues period vs. perinatal mood disorders, CSW discussed treatment and gave resources for mental health follow up if concerns arise.  CSW recommends self-evaluation during the postpartum time period using the New Mom Checklist from Postpartum Progress and encouraged MOB to contact a medical professional if symptoms are noted at any time.    CSW identifies no further need for intervention and screening out consult. No barriers to discharge at this time.  Abundio Miu, [place] Worker [facility] Cell#: [phone]

## 2021-01-08 LAB — SURGICAL PATHOLOGY

## 2021-01-21 DIAGNOSIS — R350 Frequency of micturition: Secondary | ICD-10-CM | POA: Diagnosis not present

## 2021-02-19 ENCOUNTER — Ambulatory Visit
Admission: RE | Admit: 2021-02-19 | Discharge: 2021-02-19 | Disposition: A | Discharge status: Home / Self Care | Payer: BC Managed Care – PPO | Source: Ambulatory Visit | Attending: Obstetrics and Gynecology | Admitting: Obstetrics and Gynecology

## 2021-02-19 ENCOUNTER — Other Ambulatory Visit: Payer: Self-pay

## 2021-02-19 DIAGNOSIS — N6320 Unspecified lump in the left breast, unspecified quadrant: Secondary | ICD-10-CM

## 2021-03-13 ENCOUNTER — Other Ambulatory Visit: Payer: Self-pay

## 2021-03-13 ENCOUNTER — Encounter: Payer: Self-pay | Admitting: Family Medicine

## 2021-03-13 ENCOUNTER — Telehealth (INDEPENDENT_AMBULATORY_CARE_PROVIDER_SITE_OTHER): Payer: BC Managed Care – PPO | Admitting: Family Medicine

## 2021-03-13 VITALS — Wt 170.3 lb

## 2021-03-13 DIAGNOSIS — F419 Anxiety disorder, unspecified: Secondary | ICD-10-CM

## 2021-03-13 MED ORDER — ESCITALOPRAM OXALATE 10 MG PO TABS: 10.0000 mg | ORAL_TABLET | Freq: Every day | ORAL | 1 refills | Status: DC

## 2021-03-13 NOTE — Patient Instructions (Signed)
Start lexapro 10 mg qd. Follow up in 5-6 weeks.   Generalized Anxiety Disorder, Adult Generalized anxiety disorder (GAD) is a mental health condition. Unlike normal worries, anxiety related to GAD is not triggered by a specific event. These worries do not fade or get better with time. GAD interferes with relationships, work, and school. GAD symptoms can vary from mild to severe. People with severe GAD can have intense waves of anxiety with physical symptoms that are similar to panic attacks. What are the causes? The exact cause of GAD is not known, but the following are believed to have an impact: Differences in natural brain chemicals. Genes passed down from parents to children. Differences in the way threats are perceived. Development during childhood. Personality. What increases the risk? The following factors may make you more likely to develop this condition: Being female. Having a family history of anxiety disorders. Being very shy. Experiencing very stressful life events, such as the death of a loved one. Having a very stressful family environment. What are the signs or symptoms? People with GAD often worry excessively about many things in their lives, such as their health and family. Symptoms may also include: Mental and emotional symptoms: Worrying excessively about natural disasters. Fear of being late. Difficulty concentrating. Fears that others are judging your performance. Physical symptoms: Fatigue. Headaches, muscle tension, muscle twitches, trembling, or feeling shaky. Feeling like your heart is pounding or beating very fast. Feeling out of breath or like you cannot take a deep breath. Having trouble falling asleep or staying asleep, or experiencing restlessness. Sweating. Nausea, diarrhea, or irritable bowel syndrome (IBS). Behavioral symptoms: Experiencing erratic moods or irritability. Avoidance of new situations. Avoidance of people. Extreme difficulty  making decisions. How is this diagnosed? This condition is diagnosed based on your symptoms and medical history. You will also have a physical exam. Your health care provider may perform tests to rule out other possible causes of your symptoms. To be diagnosed with GAD, a person must have anxiety that: Is out of his or her control. Affects several different aspects of his or her life, such as work and relationships. Causes distress that makes him or her unable to take part in normal activities. Includes at least three symptoms of GAD, such as restlessness, fatigue, trouble concentrating, irritability, muscle tension, or sleep problems. Before your health care provider can confirm a diagnosis of GAD, these symptoms must be present more days than they are not, and they must last for 6 months or longer. How is this treated? This condition may be treated with: Medicine. Antidepressant medicine is usually prescribed for long-term daily control. Anti-anxiety medicines may be added in severe cases, especially when panic attacks occur. Talk therapy (psychotherapy). Certain types of talk therapy can be helpful in treating GAD by providing support, education, and guidance. Options include: Cognitive behavioral therapy (CBT). People learn coping skills and self-calming techniques to ease their physical symptoms. They learn to identify unrealistic thoughts and behaviors and to replace them with more appropriate thoughts and behaviors. Acceptance and commitment therapy (ACT). This treatment teaches people how to be mindful as a way to cope with unwanted thoughts and feelings. Biofeedback. This process trains you to manage your body's response (physiological response) through breathing techniques and relaxation methods. You will work with a therapist while machines are used to monitor your physical symptoms. Stress management techniques. These include yoga, meditation, and exercise. A mental health specialist can  help determine which treatment is best for you. Some people see  improvement with one type of therapy. However, other people require a combination of therapies. Follow these instructions at home: Lifestyle Maintain a consistent routine and schedule. Anticipate stressful situations. Create a plan, and allow extra time to work with your plan. Practice stress management or self-calming techniques that you have learned from your therapist or your health care provider. General instructions Take over-the-counter and prescription medicines only as told by your health care provider. Understand that you are likely to have setbacks. Accept this and be kind to yourself as you persist to take better care of yourself. Recognize and accept your accomplishments, even if you judge them as small. Keep all follow-up visits as told by your health care provider. This is important. Contact a health care provider if: Your symptoms do not get better. Your symptoms get worse. You have signs of depression, such as: A persistently sad or irritable mood. Loss of enjoyment in activities that used to bring you joy. Change in weight or eating. Changes in sleeping habits. Avoiding friends or family members. Loss of energy for normal tasks. Feelings of guilt or worthlessness. Get help right away if: You have serious thoughts about hurting yourself or others. If you ever feel like you may hurt yourself or others, or have thoughts about taking your own life, get help right away. Go to your nearest emergency department or: Call your local emergency services (911 in the [place] Call a suicide crisis helpline, such as the [place] at 267-335-6432. This is open 24 hours a day in the [place] Text the Crisis Text Line at 651 452 1561 (in the [place].). Summary Generalized anxiety disorder (GAD) is a mental health condition that involves worry that is not triggered by a specific event. People with GAD often  worry excessively about many things in their lives, such as their health and family. GAD may cause symptoms such as restlessness, trouble concentrating, sleep problems, frequent sweating, nausea, diarrhea, headaches, and trembling or muscle twitching. A mental health specialist can help determine which treatment is best for you. Some people see improvement with one type of therapy. However, other people require a combination of therapies. This information is not intended to replace advice given to you by your health care provider. Make sure you discuss any questions you have with your health care provider. Document Revised: 03/09/2019 Document Reviewed: 03/09/2019 Elsevier Patient Education  [place].

## 2021-03-13 NOTE — Progress Notes (Signed)
VIRTUAL VISIT VIA VIDEO  I connected with Jacqueline Davidson on 03/13/21 at 11:00 AM EDT by elemedicine application and verified that I am speaking with the correct person using two identifiers. Location patient: Home Location provider: [facility], Office Persons participating in the virtual visit: Patient, Dr. Raoul Pitch and Darnell Level. Cesar, CMA  I discussed the limitations of evaluation and management by telemedicine and the availability of in person appointments. The patient expressed understanding and agreed to proceed.   SUBJECTIVE Chief Complaint  Patient presents with   Anxiety    HPI: Jacqueline Davidson is a 35 y.o. female present to discuss her worsening anxiety.  Anxiety: pt reports she has struggled w/ anxiety the majority of her adult life. 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 She reports she can lose her appetite, lose sleep  and cause her kids to have doctors appt/evaluations they may not need because of her worry. She has never sought therapy or medication treatment for her anxiousness in the past. She states she had her 3rd child 9 weeks ago and is feeling overwhelmed trying to adjust to motherhood of three young children and she is more irritable. She is seeking treatment options today. She is concerned she is being unfair to her kids/family with her anxiousness now.   GAD 7 : Generalized Anxiety Score 03/13/2021  Nervous, Anxious, on Edge 2  Control/stop worrying 2  Worry too much - different things 0  Trouble relaxing 1  Restless 0  Easily annoyed or irritable 3  Afraid - awful might happen 0  Total GAD 7 Score 8    Depression screen [facility] 2/9 03/13/2021 07/11/2019  Decreased Interest 1 0  Down, Depressed, Hopeless 1 0  PHQ - 2 Score 2 0  Altered sleeping 1 -  Tired, decreased energy 1 -  Change in appetite 0 -  Feeling bad or failure about yourself  0 -  Trouble concentrating 0 -  Moving  slowly or fidgety/restless 0 -  Suicidal thoughts 0 -  PHQ-9 Score 4 -     ROS: See pertinent positives and negatives per HPI.  Patient Active Problem List   Diagnosis Date Noted   History of cesarean delivery 01/04/2021   Anxiety 07/18/2019   Gastroesophageal reflux disease 07/18/2019   Birth control counseling 07/18/2019   Vitamin D deficiency 07/12/2019   Acute non-recurrent maxillary sinusitis 07/12/2019   Nausea 07/12/2019   Other fatigue 07/12/2019   PCOS (polycystic ovarian syndrome) 07/11/2019    Social History   Tobacco Use   Smoking status: Never   Smokeless tobacco: Never  Substance Use Topics   Alcohol use: Yes    Comment: very rarely    Current Outpatient Medications:    escitalopram (LEXAPRO) 10 MG tablet, Take 1 tablet (10 mg total) by mouth daily., Disp: 90 tablet, Rfl: 1   oxyCODONE-acetaminophen (PERCOCET/ROXICET) 5-325 MG tablet, Take 1-2 tablets by mouth every 6 (six) hours as needed for severe pain., Disp: 30 tablet, Rfl: 0   Prenatal Vit-Fe Fumarate-FA (PRENATAL MULTIVITAMIN) TABS tablet, Take 1 tablet by mouth daily at 12 noon., Disp: , Rfl:   No Known Allergies  OBJECTIVE: Wt 170 lb 4.8 oz (77.2 kg)   LMP 04/06/2020   BMI 31.15 kg/m  Gen: No acute distress. Nontoxic in appearance.  HENT: AT. [place].   Eyes:Pupils Equal Round Reactive to light, Extraocular movements intact,  Conjunctiva without redness, discharge or icterus. Neuro: .  Alert. Oriented x3  Psych: Normal affect and demeanor. Normal speech. Normal thought content and judgment.  ASSESSMENT AND PLAN: Jacqueline Davidson is a 35 y.o. female present for  Anxiety/post partum- not breastfeeding.  Pt present today to discuss worsening anxiety.  We discussed different options for her including medications and counseling. She would like to wait on counseling for now.  Start lexapro 10 mg qd.  F/u 5-6 weeks. VV is ok.    Howard Pouch, DO 03/13/2021   Return in about 6 weeks (around  04/24/2021) for anxiety.  No orders of the defined types were placed in this encounter.  Meds ordered this encounter  Medications   escitalopram (LEXAPRO) 10 MG tablet    Sig: Take 1 tablet (10 mg total) by mouth daily.    Dispense:  90 tablet    Refill:  1    Referral Orders  No referral(s) requested today

## 2021-03-20 ENCOUNTER — Encounter: Payer: Self-pay | Admitting: Family Medicine

## 2021-03-20 NOTE — Telephone Encounter (Signed)
Please advise 

## 2021-05-17 ENCOUNTER — Encounter: Payer: Self-pay | Admitting: Family Medicine

## 2021-05-17 NOTE — Telephone Encounter (Signed)
Pt declined scheduling

## 2021-06-11 DIAGNOSIS — N390 Urinary tract infection, site not specified: Secondary | ICD-10-CM | POA: Diagnosis not present

## 2021-07-29 IMAGING — US US BREAST*L* LIMITED INC AXILLA
1 series · 5 of 5 positions shown · non-contrast
Comparison: None.

CLINICAL DATA: Patient reports a palpable lump in her left breast,
which she has noticed for approximately 1 month. She recently
discovered that she is pregnant.

EXAM:
ULTRASOUND OF THE LEFT BREAST

[Series 1: us breast*left* limited inc axilla · 0.05mm/px · 5 of 5 slices shown]
[im 1/5]
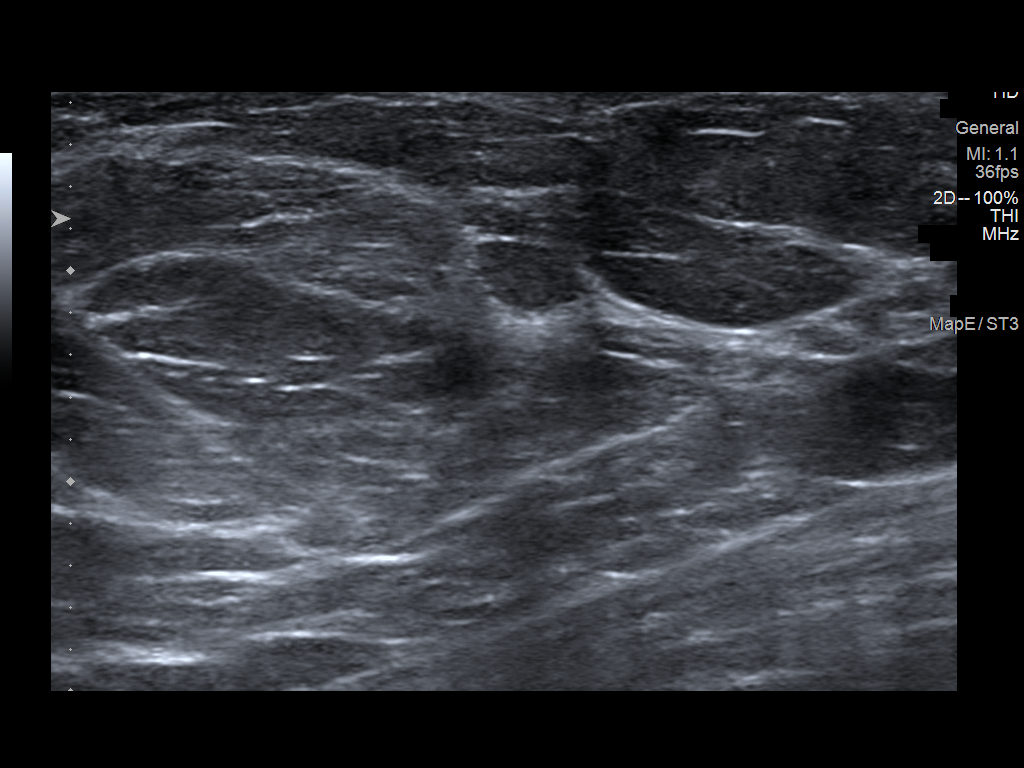
[im 2/5]
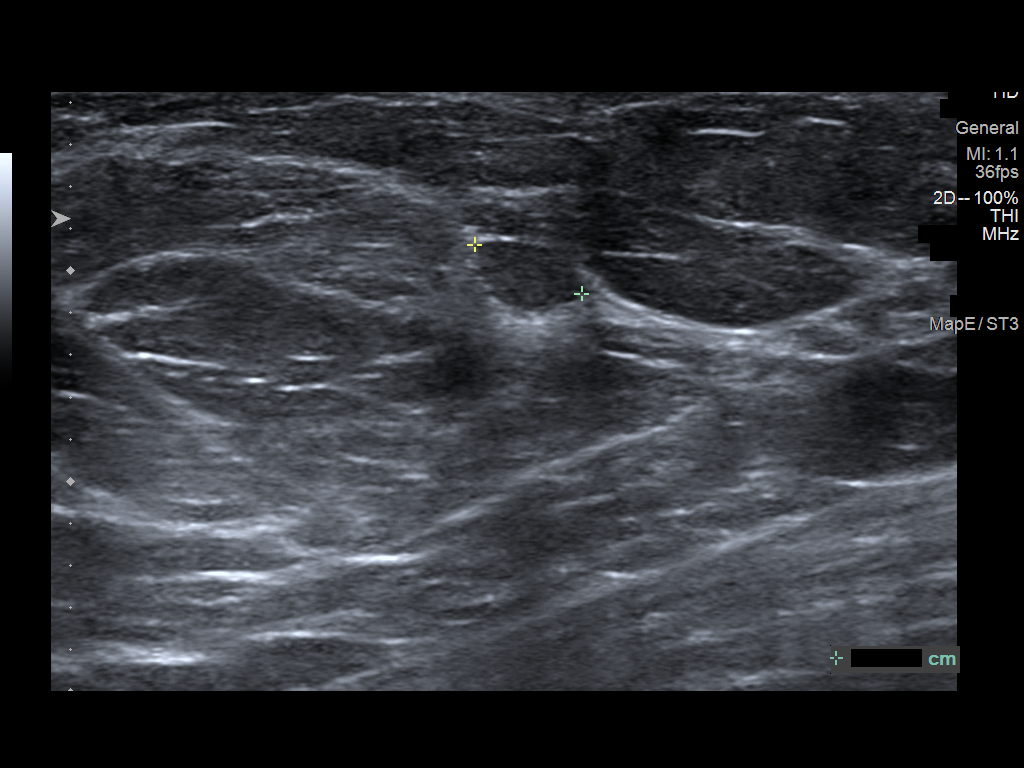
[im 3/5]
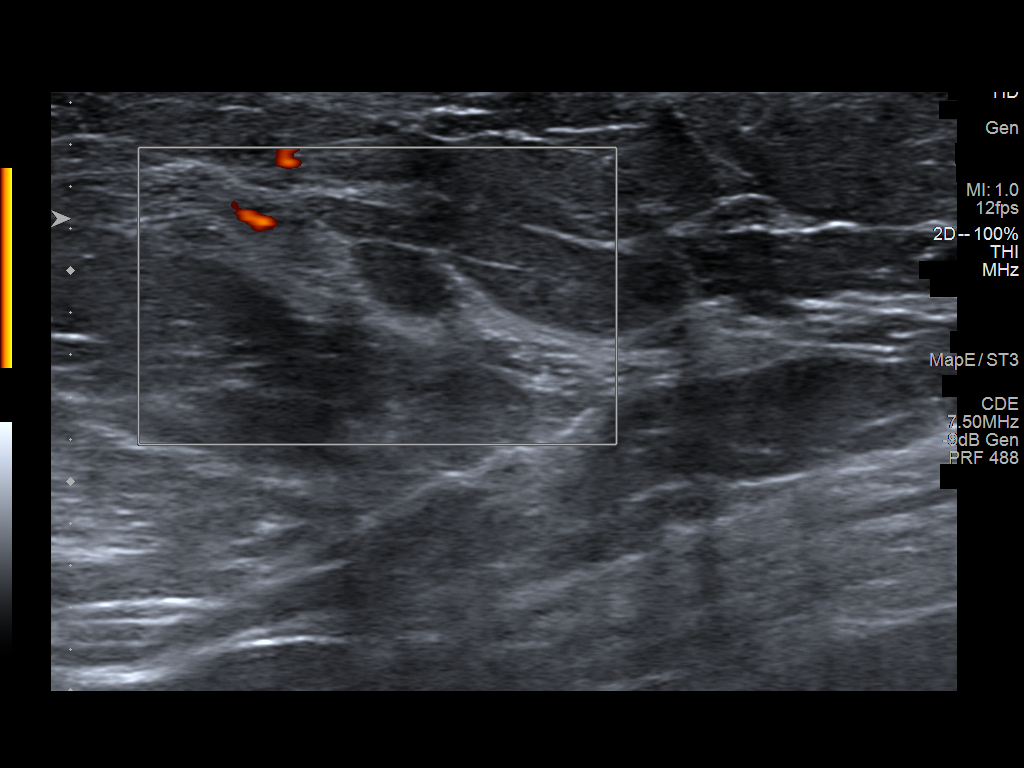
[im 4/5]
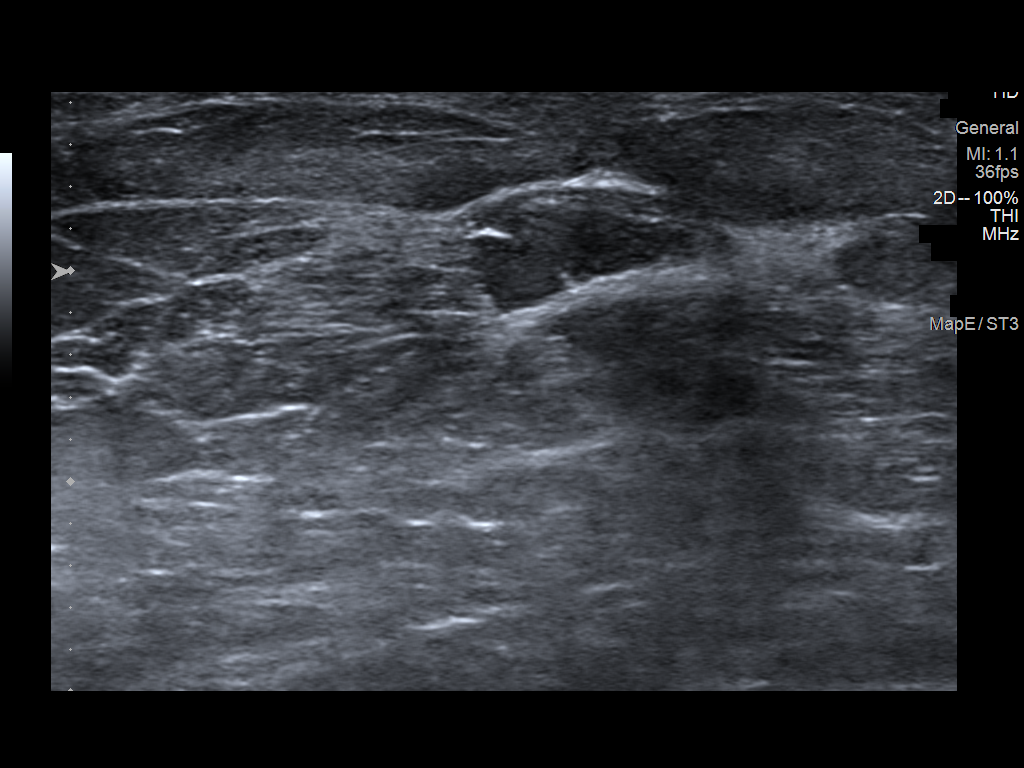
[im 5/5]
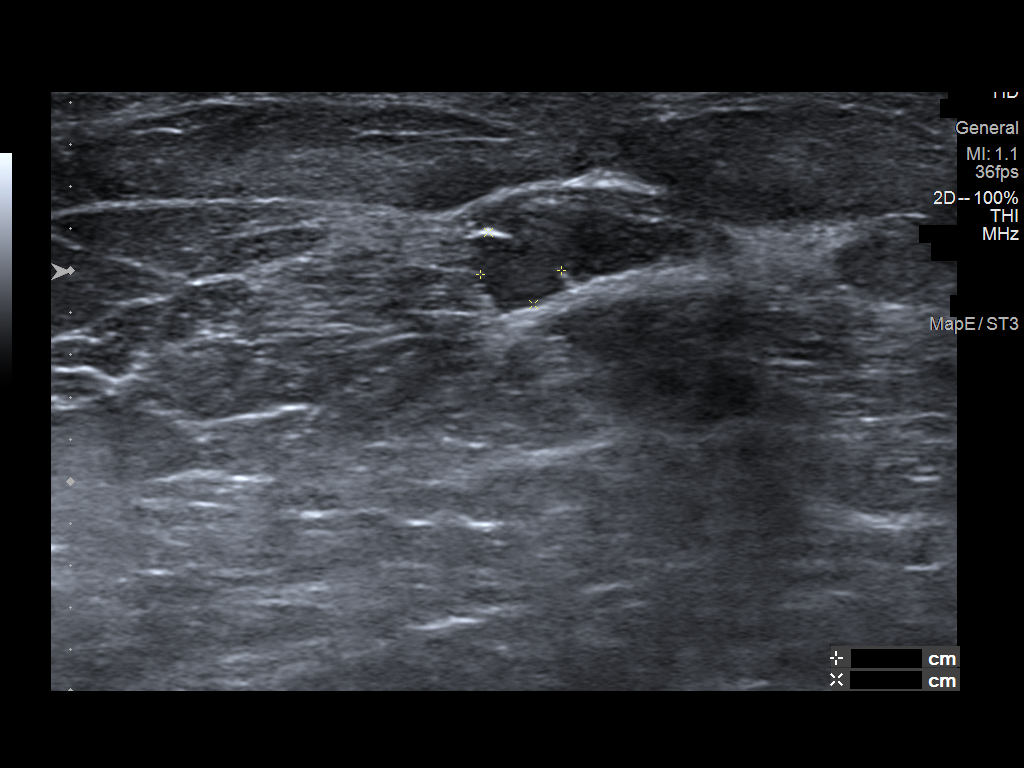

[5 of 5 positions shown; findings below may reference images not displayed]

FINDINGS: On physical exam, no definite mass is palpated the upper outer left
breast.

Targeted ultrasound is performed, showing a small circumscribed oval
mass, nearly isoechoic to fat, at 1:30 o'clock, 7 cm the nipple,
measuring 6 x 4 x 4 mm. This lies in the area of the reported
palpable abnormality. No other abnormalities.
IMPRESSION: 1. 6 mm probably benign small mass in the left breast, most likely a
fibroadenoma. This likely accounts for a component of the reported
palpable breast lump, the remainder due to adjacent normal
fibroglandular tissue.

RECOMMENDATION:
Left breast ultrasound in 6 months to reassess the small, 1:30
o'clock position, probably benign mass.

I have discussed the findings and recommendations with the patient.
If applicable, a reminder letter will be sent to the patient
regarding the next appointment.

BI-RADS CATEGORY  3: Probably benign.

## 2021-08-09 ENCOUNTER — Telehealth: Payer: BC Managed Care – PPO | Admitting: Family Medicine

## 2021-08-12 ENCOUNTER — Other Ambulatory Visit: Payer: Self-pay | Admitting: Family Medicine

## 2021-08-12 ENCOUNTER — Encounter: Payer: Self-pay | Admitting: Family Medicine

## 2021-08-12 ENCOUNTER — Telehealth (INDEPENDENT_AMBULATORY_CARE_PROVIDER_SITE_OTHER): Payer: BC Managed Care – PPO | Admitting: Family Medicine

## 2021-08-12 ENCOUNTER — Other Ambulatory Visit: Payer: Self-pay

## 2021-08-12 DIAGNOSIS — F419 Anxiety disorder, unspecified: Secondary | ICD-10-CM | POA: Diagnosis not present

## 2021-08-12 DIAGNOSIS — R350 Frequency of micturition: Secondary | ICD-10-CM | POA: Diagnosis not present

## 2021-08-12 MED ORDER — FLUOXETINE HCL 20 MG PO TABS: 20.0000 mg | ORAL_TABLET | Freq: Every day | ORAL | 1 refills | Status: DC

## 2021-08-12 NOTE — Telephone Encounter (Signed)
Rx not covered. Please advise.  ?

## 2021-08-12 NOTE — Telephone Encounter (Signed)
Covered caps not tabs ?

## 2021-08-12 NOTE — Progress Notes (Signed)
? ?VIRTUAL VISIT VIA VIDEO ? ?I connected with Jacqueline Davidson on 08/12/21 at  2:00 PM EDT by elemedicine application and verified that I am speaking with the correct person using two identifiers. ?Location patient: Home ?Location provider: [facility], Office ?Persons participating in the virtual visit: Patient, Dr. Raoul Pitch and Darnell Level. Jacqueline Davidson, CMA ? ?I discussed the limitations of evaluation and management by telemedicine and the availability of in person appointments. The patient expressed understanding and agreed to proceed. ? ? ?SUBJECTIVE ?Chief Complaint  ?Patient presents with  ? Anxiety  ? ? ?HPI: Jacqueline Davidson is a 36 y.o. female present to discuss her worsening anxiety.  ?Anxiety:  ?Patient reports she did not feel the Lexapro 10 mg is working well for her.  She feels it may have made her actually more tired.  She did not want to focus and when at work she had difficulty engaging in work.  She reports her sister takes Prozac and she is wondering if that will work better for her.  She also states she used to take ADHD meds when she was in college. ?Prior note: ?pt reports she has struggled w/ anxiety the majority of her adult life. 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 She reports she can lose her appetite, lose sleep  and cause her kids to have doctors appt/evaluations they may not need because of her worry. She has never sought therapy or medication treatment for her anxiousness in the past. She states she had her 3rd child 9 weeks ago and is feeling overwhelmed trying to adjust to motherhood of three young children and she is more irritable. She is seeking treatment options today. She is concerned she is being unfair to her kids/family with her anxiousness now.  ? ?GAD 7 : Generalized Anxiety Score 08/12/2021 03/13/2021  ?Nervous, Anxious, on Edge 2 2  ?Control/stop worrying 3 2  ?Worry too much - different things 1 0  ?Trouble  relaxing 0 1  ?Restless 0 0  ?Easily annoyed or irritable 1 3  ?Afraid - awful might happen 1 0  ?Total GAD 7 Score 8 8  ? ? ?Depression screen [facility] 2/9 08/12/2021 03/13/2021 07/11/2019  ?Decreased Interest 1 1 0  ?Down, Depressed, Hopeless 2 1 0  ?PHQ - 2 Score 3 2 0  ?Altered sleeping 0 1 -  ?Tired, decreased energy 3 1 -  ?Change in appetite 0 0 -  ?Feeling bad or failure about yourself  0 0 -  ?Trouble concentrating 3 0 -  ?Moving slowly or fidgety/restless 0 0 -  ?Suicidal thoughts 0 0 -  ?PHQ-9 Score 9 4 -  ? ? ? ?ROS: See pertinent positives and negatives per HPI. ? ?Patient Active Problem List  ? Diagnosis Date Noted  ? Postpartum state 03/13/2021  ? History of cesarean delivery 01/04/2021  ? Anxiety 07/18/2019  ? Gastroesophageal reflux disease 07/18/2019  ? Birth control counseling 07/18/2019  ? Vitamin D deficiency 07/12/2019  ? Acute non-recurrent maxillary sinusitis 07/12/2019  ? Nausea 07/12/2019  ? Other fatigue 07/12/2019  ? PCOS (polycystic ovarian syndrome) 07/11/2019  ?  ?Social History  ? ?Tobacco Use  ? Smoking status: Never  ? Smokeless tobacco: Never  ?Substance Use Topics  ? Alcohol use: Yes  ?  Comment: very rarely  ? ? ?Current Outpatient Medications:  ?  escitalopram (LEXAPRO) 10 MG tablet, Take 1 tablet (10 mg total) by mouth daily., Disp:  90 tablet, Rfl: 1 ? ?No Known Allergies ? ?OBJECTIVE: ?There were no vitals taken for this visit. ?Gen: Afebrile. No acute distress.  ?HENT: AT. [place]. ?Neuro: Alert. Oriented.  ?Psych: Normal affect, dress and demeanor. Normal speech. Normal thought content and judgment..  ? ? ?ASSESSMENT AND PLAN: ?Airis Barbee is a 36 y.o. female present for  ?Anxiety/post partum-  ?DC Lexapro. ?Start Prozac 20 mg daily. ?We discussed different options for her including medications and counseling. She would like to wait on counseling for now.  ?Would like to see if we can get her anxiety more controlled before initiating ADH D meds.  Hopefully once she is feeling  less overwhelmed her focus will improve. ?F/u 7-8 weeks. VV is ok.  As long as she does not want to cover ADHD meds as well. ? ? ?Howard Pouch, DO ?08/12/2021 ? ? ?No follow-ups on file. ? ?No orders of the defined types were placed in this encounter. ? ? ?No orders of the defined types were placed in this encounter. ? ? ?Referral Orders  ?No referral(s) requested today  ? ? ? ? ?

## 2021-08-26 ENCOUNTER — Encounter: Payer: Self-pay | Admitting: Family Medicine

## 2021-08-26 NOTE — Telephone Encounter (Signed)
Please advise 

## 2021-11-19 IMAGING — US US MFM OB FOLLOW-UP
1 series · 14 of 28 positions shown · non-contrast
Comparison: none

[Series 1: us mfm ob follow-up · 57 acquisitions, 14 frames shown]
[im 3/57]
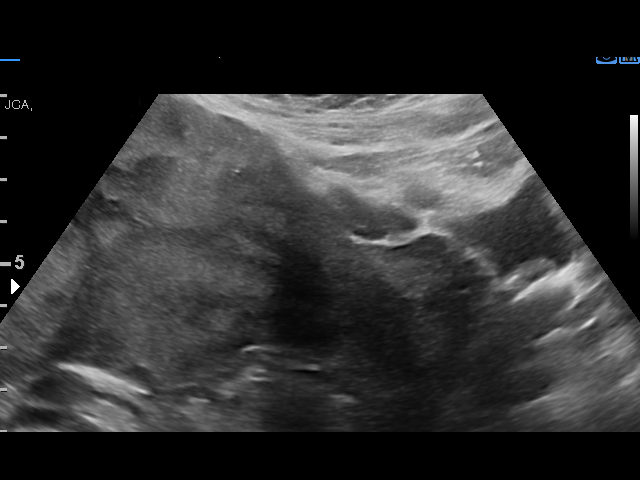
[im 7/57]
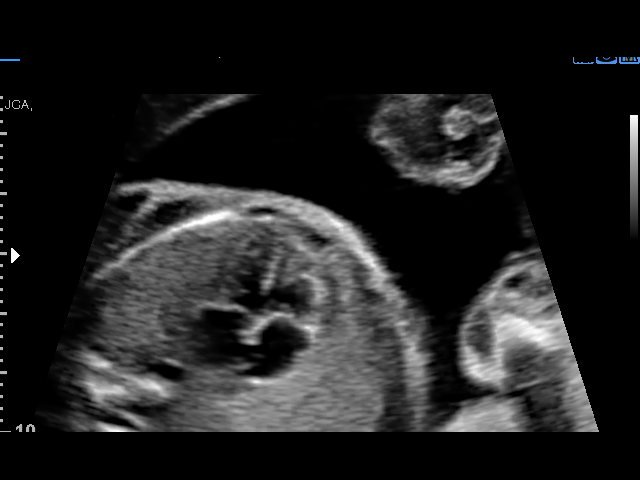
[im 11/57]
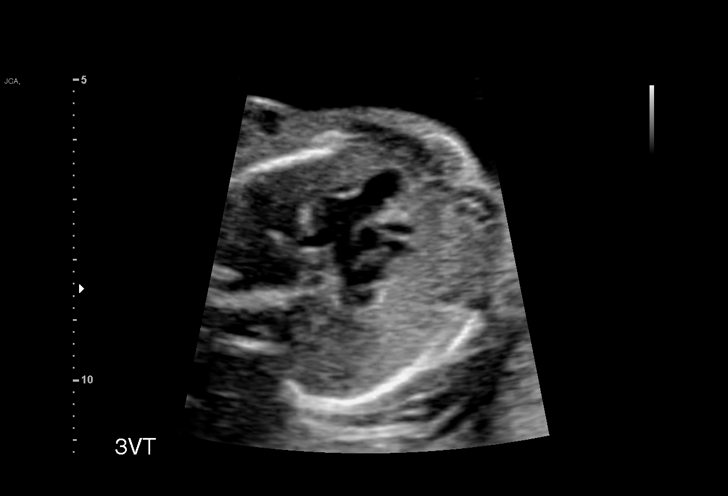
[im 15/57]
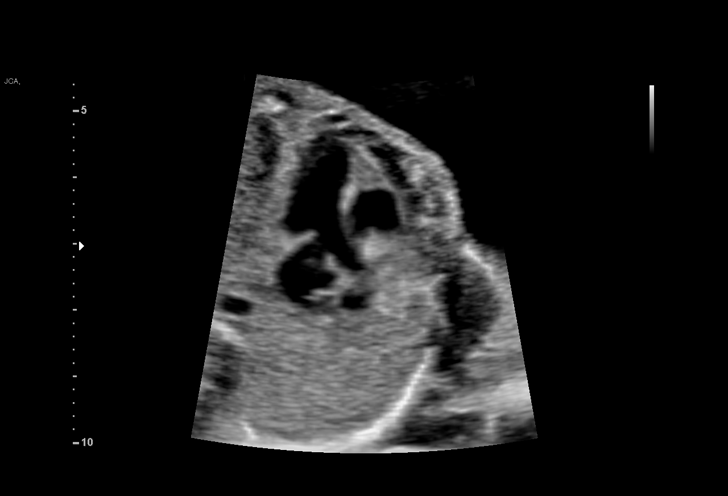
[im 19/57]
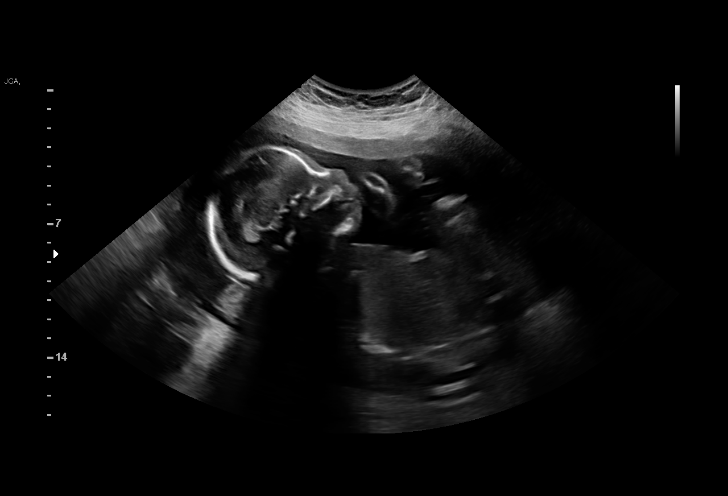
[im 23/57]
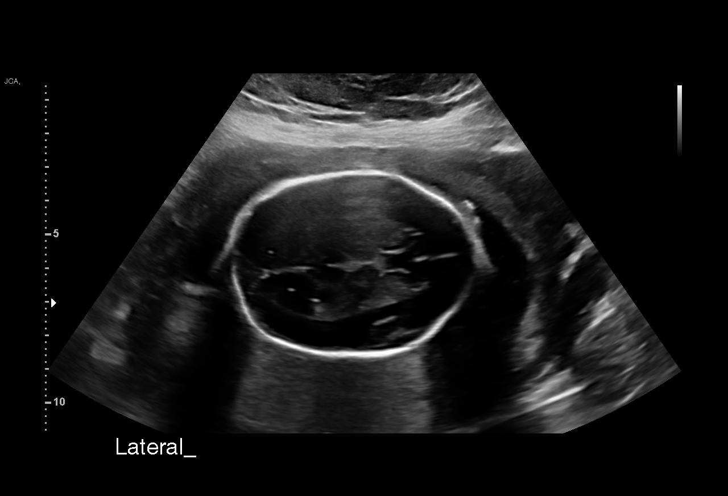
[im 27/57]
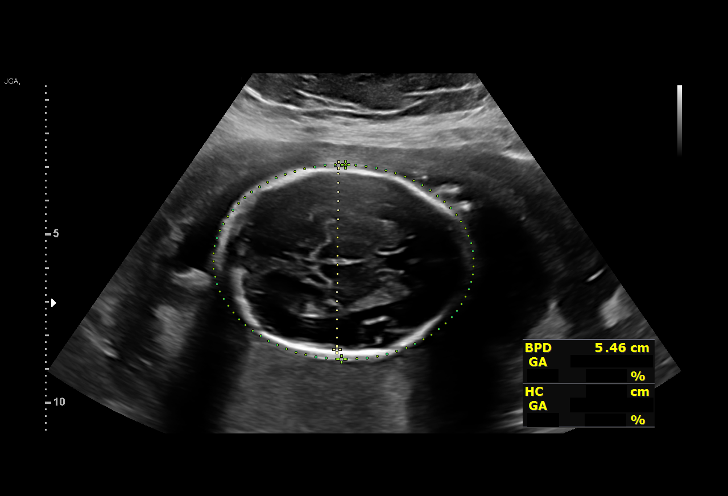
[im 32/57]
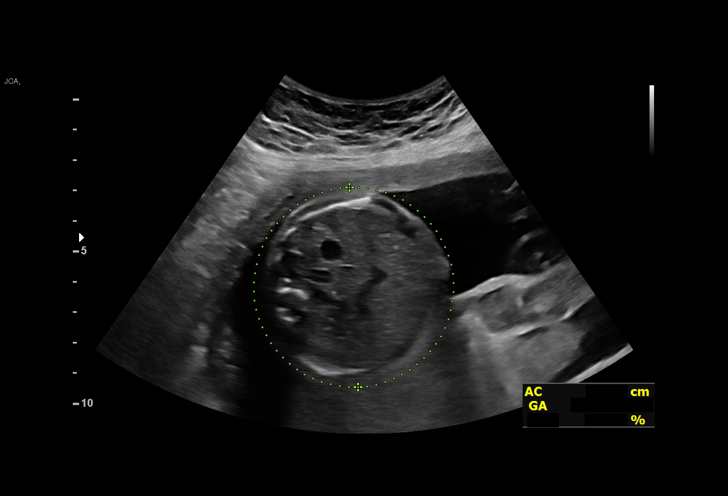
[im 36/57]
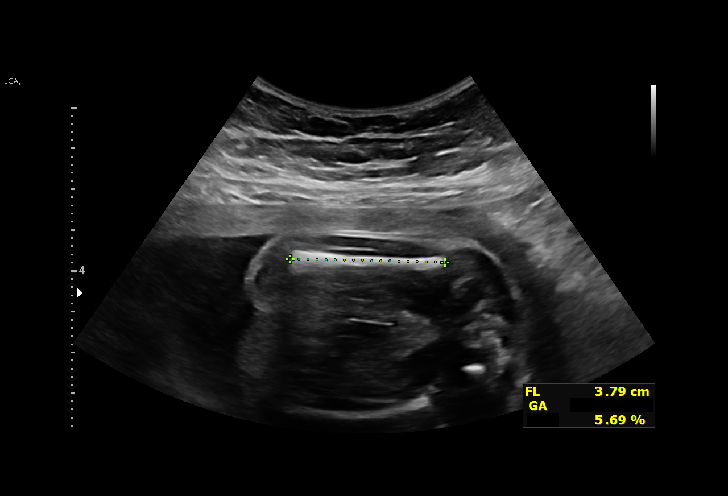
[im 40/57]
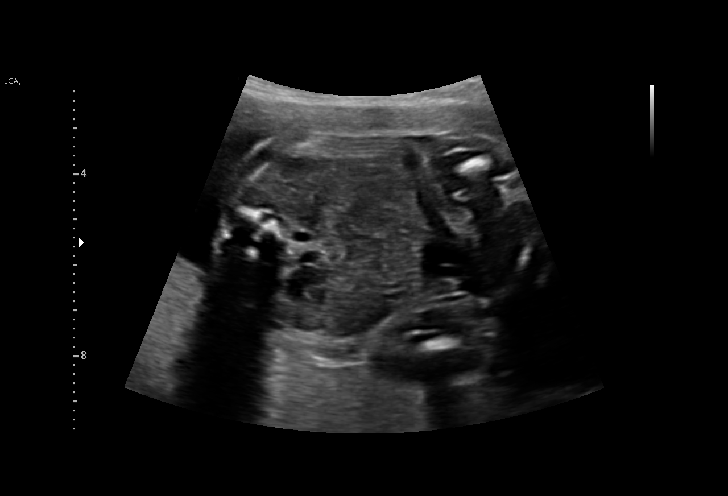
[im 44/57]
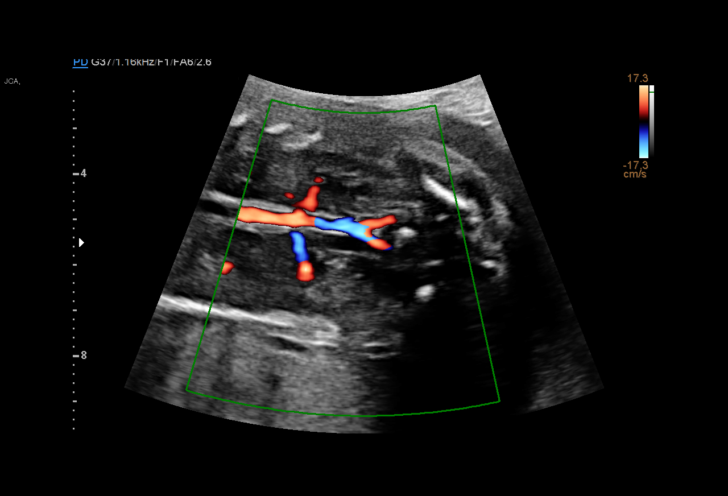
[im 48/57]
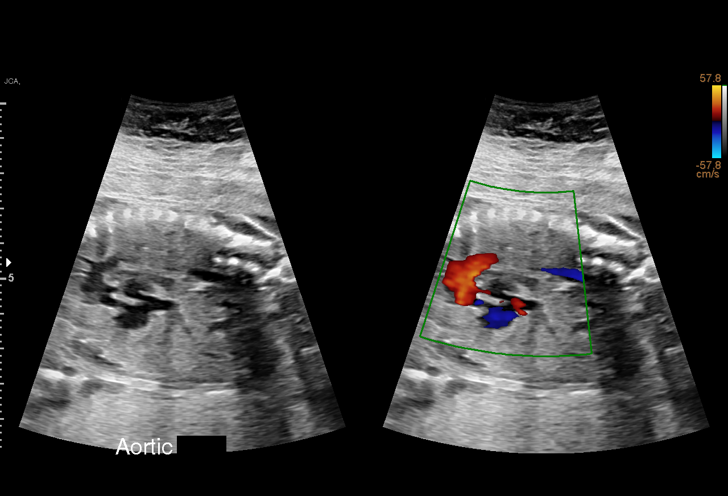
[im 52/57]
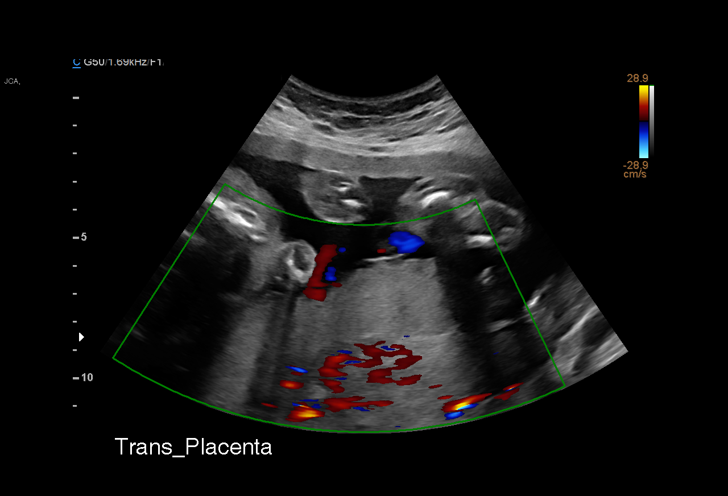
[im 57/57]
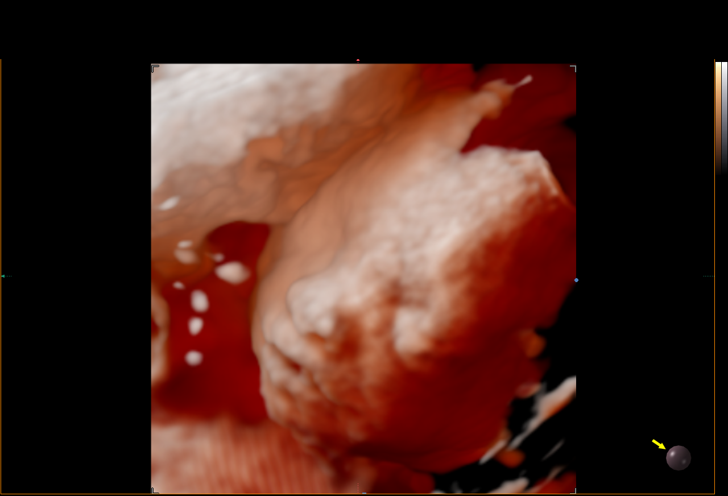

[14 of 28 positions shown; findings below may reference images not displayed]

OBGYN
                                                            [REDACTED]
                   JUMPER

Indications

 Encounter for antenatal screening for
 malformations (Declined genetic testing)
 History of cesarean delivery, currently
 pregnant
 Medical complication of pregnancy (Covid
 19, PCOS, Anxiety
 Prior poor obstetrical history antepartum,
 second trimester (Umbilical cord prolapse)
 23 weeks gestation of pregnancy
Fetal Evaluation

 Num Of Fetuses:         1
 Fetal Heart Rate(bpm):  162
 Cardiac Activity:       Observed
 Presentation:           Transverse, head to maternal right
 Placenta:               Posterior
 P. Cord Insertion:      Visualized

 Amniotic Fluid
 AFI FV:      Within normal limits

                             Largest Pocket(cm)

Biometry
 BPD:      54.7  mm     G. Age:  22w 5d         15  %    CI:         67.5   %    70 - 86
                                                         FL/HC:      17.9   %    18.7 -
 HC:      213.1  mm     G. Age:  23w 3d         26  %    HC/AC:      1.04        1.05 -
 AC:      205.2  mm     G. Age:  25w 1d         85  %    FL/BPD:     69.8   %    71 - 87
 FL:       38.2  mm     G. Age:  22w 2d          7  %    FL/AC:      18.6   %    20 - 24
 HUM:      35.1  mm     G. Age:  22w 0d          9  %
 LV:        4.7  mm

 Est. FW:     626  gm      1 lb 6 oz     51  %
OB History

 Gravidity:    3
 Living:       2
Gestational Age

 LMP:           23w 4d        Date:  04/06/20                 EDD:   01/11/21
 U/S Today:     23w 3d                                        EDD:   01/12/21
 Best:          23w 4d     Det. By:  LMP  (04/06/20)          EDD:   01/11/21
Anatomy

 Cranium:               Appears normal         Aortic Arch:            Appears normal
 Cavum:                 Appears normal         Ductal Arch:            Appears normal
 Ventricles:            Appears normal         Diaphragm:              Appears normal
 Choroid Plexus:        Previously seen        Stomach:                Appears normal, left
                                                                       sided
 Cerebellum:            Previously seen        Abdomen:                Previously seen
 Posterior Fossa:       Previously seen        Abdominal Wall:         Previously seen
 Nuchal Fold:           Not applicable (>20    Cord Vessels:           Previously seen
                        wks GA)
 Face:                  Orbits and profile     Kidneys:                Appear normal
                        previously seen
 Lips:                  Appears normal         Bladder:                Appears normal
 Thoracic:              Appears normal         Spine:                  Limited views
                                                                       previously seen
 Heart:                 Appears normal         Upper Extremities:      Previously seen
                        (4CH, axis, and
                        situs)
 RVOT:                  Appears normal         Lower Extremities:      Previously seen
 LVOT:                  Appears normal

 Other:  Lenses visualized. Heels/feet and 5th digits visualized.  Fetus
         appears to be a male. Nasal bone visualized.
Cervix Uterus Adnexa

 Cervix
 Length:            4.8  cm.
 Normal appearance by transabdominal scan.

 Uterus
 No abnormality visualized.
 Right Ovary
 Not visualized.

 Left Ovary
 Not visualized.

 Cul De Sac
 No free fluid seen.

 Adnexa
 No abnormality visualized.
Impression

 Follow up growth due to complete the fetal anatomy.
 Anatomy completed today.
 Normal interval growth with measurements consistent with
 dates
 Good fetal movement and amniotic fluid volume
Recommendations

 Follow up as clinically indicated.

## 2021-11-28 ENCOUNTER — Ambulatory Visit: Payer: BC Managed Care – PPO | Admitting: Family Medicine

## 2022-01-27 ENCOUNTER — Ambulatory Visit: Payer: BC Managed Care – PPO | Admitting: Family Medicine

## 2022-01-27 ENCOUNTER — Telehealth: Payer: Self-pay | Admitting: Family Medicine

## 2022-01-27 NOTE — Telephone Encounter (Signed)
This patient no showed for their appointment today.Which of the following is necessary for this patient.   E) Please Send no show letter to patient. Charge no show fee.

## 2022-01-27 NOTE — Telephone Encounter (Signed)
Pt called and canceled this morning due to her daughter having a stomach bug. She did not want to schedule a virtual visit nor reschedule at this time.

## 2022-01-28 ENCOUNTER — Encounter: Payer: Self-pay | Admitting: Family Medicine

## 2022-01-28 NOTE — Telephone Encounter (Signed)
Due to this being the first no letter documentation, this will count as first no show with no fee.

## 2022-04-07 ENCOUNTER — Encounter: Payer: Self-pay | Admitting: Family Medicine

## 2022-04-10 ENCOUNTER — Other Ambulatory Visit: Payer: Self-pay | Admitting: Family Medicine

## 2022-04-22 IMAGING — US US BREAST*L* LIMITED INC AXILLA
1 series · 5 of 5 positions shown · non-contrast
Comparison: Previous exam(s).

CLINICAL DATA: 35-year-old female for delayed six-month follow-up
of LEFT breast mass (now 9 months).

EXAM:
ULTRASOUND OF THE LEFT BREAST

[Series 1: us breast*left* limited inc axilla · 0.05mm/px · 5 of 5 slices shown]
[im 1/5]
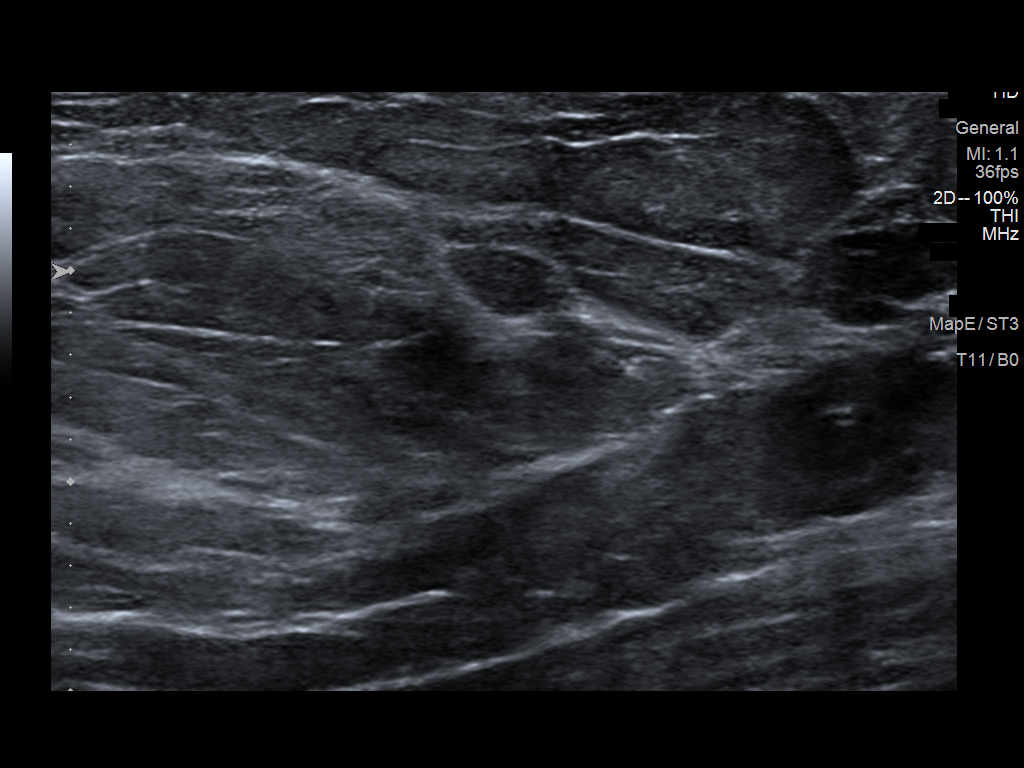
[im 2/5]
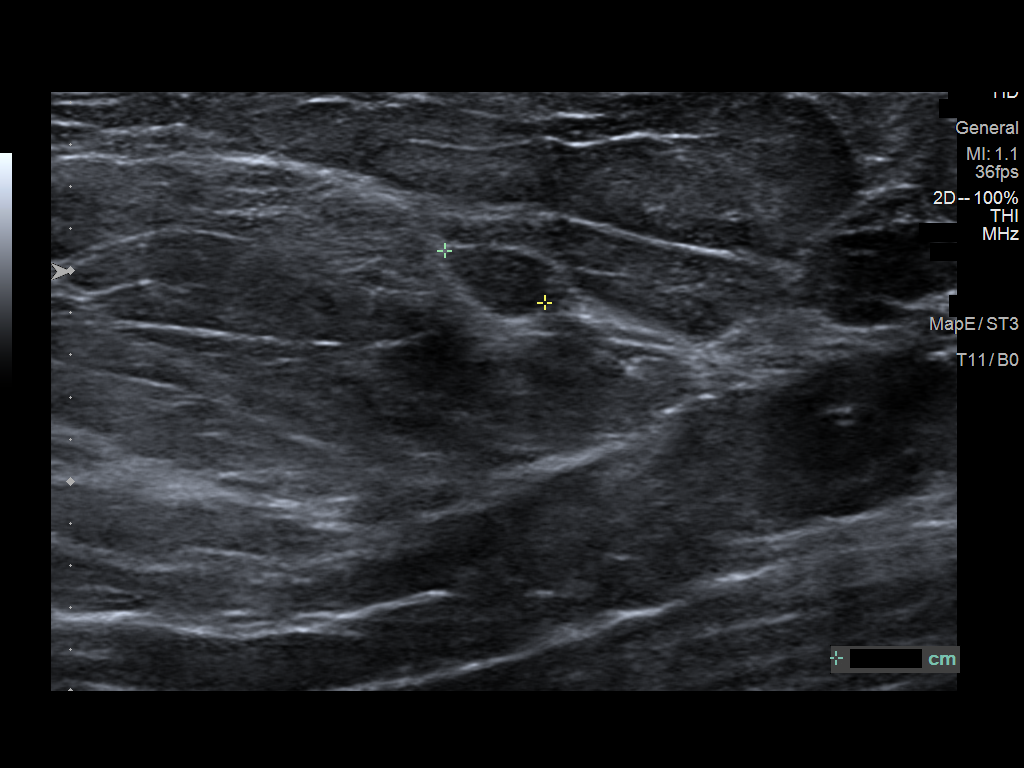
[im 3/5]
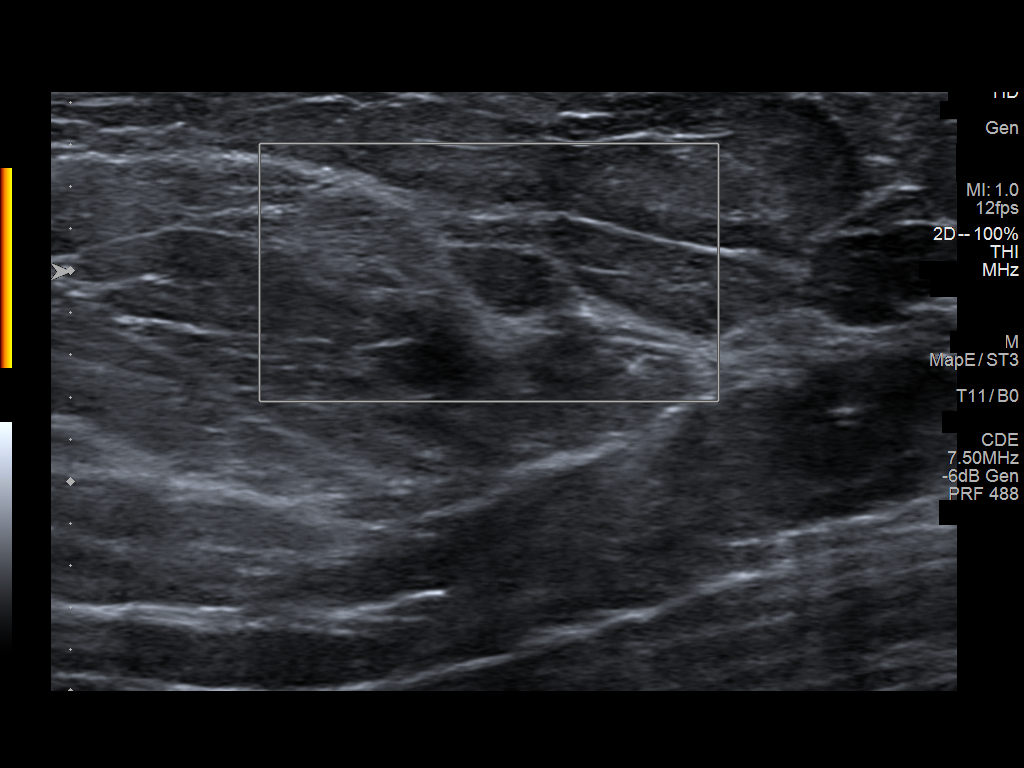
[im 4/5]
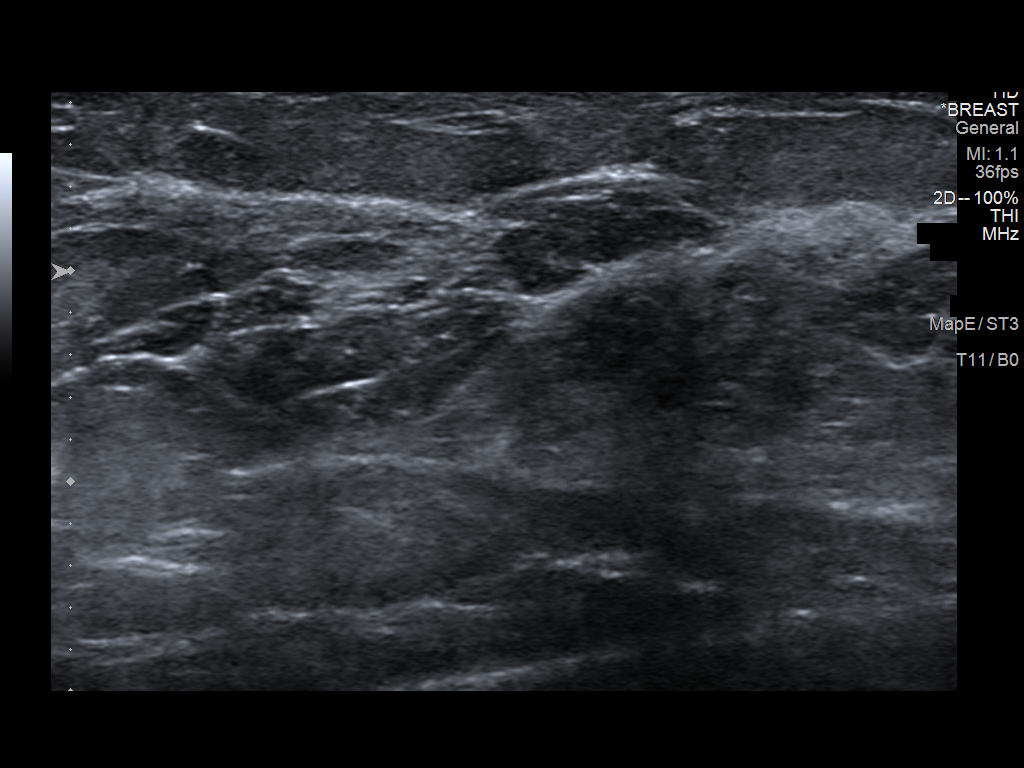
[im 5/5]
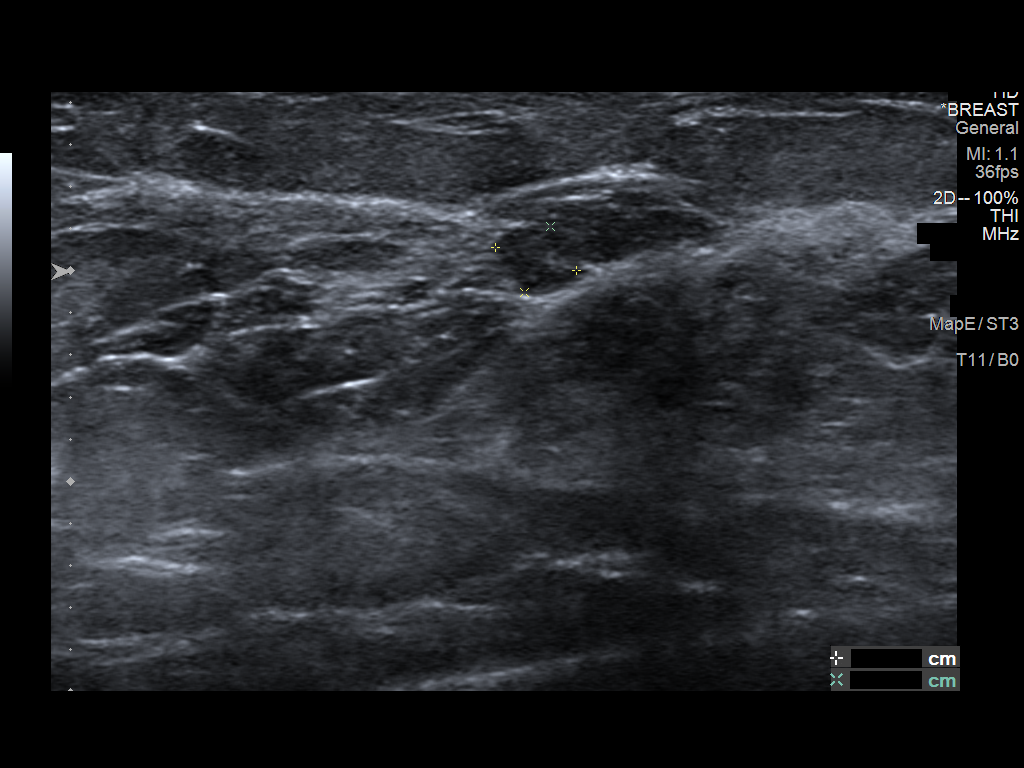

[5 of 5 positions shown; findings below may reference images not displayed]

FINDINGS: Targeted ultrasound is performed, showing a stable 0.4 x 0.3 x
cm circumscribed oval isoechoic area/mass at the [DATE] position of
the LEFT breast 7 cm from the nipple.
IMPRESSION: 1. Stable 0.5 cm isoechoic area/mass within the UPPER-OUTER LEFT
breast. One additional follow-up in May 2022 recommended to
ensure 2 year stability.

RECOMMENDATION:
LEFT breast ultrasound in May 2022.

I have discussed the findings and recommendations with the patient.
If applicable, a reminder letter will be sent to the patient
regarding the next appointment.

BI-RADS CATEGORY  3: Probably benign.

## 2022-05-13 ENCOUNTER — Other Ambulatory Visit: Payer: Self-pay | Admitting: Obstetrics and Gynecology

## 2022-05-13 DIAGNOSIS — R928 Other abnormal and inconclusive findings on diagnostic imaging of breast: Secondary | ICD-10-CM

## 2022-07-02 ENCOUNTER — Ambulatory Visit (INDEPENDENT_AMBULATORY_CARE_PROVIDER_SITE_OTHER): Payer: BC Managed Care – PPO | Admitting: Family Medicine

## 2022-07-02 ENCOUNTER — Encounter: Payer: Self-pay | Admitting: Family Medicine

## 2022-07-02 VITALS — BP 112/80 | HR 74 | Temp 97.8°F | Wt 191.0 lb

## 2022-07-02 DIAGNOSIS — F419 Anxiety disorder, unspecified: Secondary | ICD-10-CM | POA: Diagnosis not present

## 2022-07-02 DIAGNOSIS — E669 Obesity, unspecified: Secondary | ICD-10-CM

## 2022-07-02 DIAGNOSIS — E66811 Obesity, class 1: Secondary | ICD-10-CM | POA: Insufficient documentation

## 2022-07-02 MED ORDER — FLUOXETINE HCL 40 MG PO CAPS: 40.0000 mg | ORAL_CAPSULE | Freq: Every day | ORAL | 1 refills | Status: DC

## 2022-07-02 NOTE — Patient Instructions (Addendum)
No follow-ups on file.  If you are interested in weight loss counseling please make appt to discuss and bring with you 2 weeks of a food diary/log.  Food log is mandatory on first appt to proceed with counseling.  Weight loss counseling encompasses diet, exercise and medications when appropriate and affordable.  There are routine appts for check-ins and weights to track progress and keep on track. These are also mandatory to proceed with prescriptions.  Body mass index is 34.93 kg/m.       Great to see you today.  I have refilled the medication(s) we provide.   If labs were collected, we will inform you of lab results once received either by echart message or telephone call.   - echart message- for normal results that have been seen by the patient already.   - telephone call: abnormal results or if patient has not viewed results in their echart.

## 2022-07-02 NOTE — Progress Notes (Signed)
Patient Care Team    Relationship Specialty Notifications Start End  Ma Hillock, DO PCP - General Family Medicine  07/11/19   Shirl Harris, OD  Optometry  07/12/19   Alvester Morin, PA  Obstetrics and Gynecology  07/12/19      SUBJECTIVE Chief Complaint  Patient presents with   Anxiety    Also wanting to start appetite suppressant    HPI: Jacqueline Davidson is a 37 y.o. female present to Chronic Conditions/illness Management  Anxiety:  Patient reports prozac 20 mg is working well for her. She feels more leveled out. She is still have some anxiety, but better.r She also states she used to take ADHD meds when she was in college. Prior note: pt reports she has struggled w/ anxiety the majority of her adult life. 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 She reports she can lose her appetite, lose sleep  and cause her kids to have doctors appt/evaluations they may not need because of her worry. She has never sought therapy or medication treatment for her anxiousness in the past. She states she had her 3rd child 9 weeks ago and is feeling overwhelmed trying to adjust to motherhood of three young children and she is more irritable. She is seeking treatment options today. She is concerned she is being unfair to her kids/family with her anxiousness now.      07/02/2022    8:40 AM 08/12/2021    2:03 PM 03/13/2021   10:48 AM  GAD 7 : Generalized Anxiety Score  Nervous, Anxious, on Edge 0 2 2  Control/stop worrying '2 3 2  '$ Worry too much - different things 2 1 0  Trouble relaxing 0 0 1  Restless 0 0 0  Easily annoyed or irritable '2 1 3  '$ Afraid - awful might happen 1 1 0  Total GAD 7 Score '7 8 8       '$ 07/02/2022    8:38 AM 08/12/2021    2:01 PM 03/13/2021   10:47 AM 07/11/2019    3:39 PM  Depression screen PHQ 2/9  Decreased Interest '1 1 1 '$ 0  Down, Depressed, Hopeless '1 2 1 '$ 0  PHQ - 2 Score '2 3 2 '$ 0  Altered sleeping  0 0 1   Tired, decreased energy '3 3 1   '$ Change in appetite 2 0 0   Feeling bad or failure about yourself  0 0 0   Trouble concentrating 2 3 0   Moving slowly or fidgety/restless 0 0 0   Suicidal thoughts 0 0 0   PHQ-9 Score '9 9 4      '$ ROS: See pertinent positives and negatives per HPI.  Patient Active Problem List   Diagnosis Date Noted   Postpartum state 03/13/2021   History of cesarean delivery 01/04/2021   Anxiety 07/18/2019   Gastroesophageal reflux disease 07/18/2019   Birth control counseling 07/18/2019   Vitamin D deficiency 07/12/2019   Acute non-recurrent maxillary sinusitis 07/12/2019   Nausea 07/12/2019   Other fatigue 07/12/2019   PCOS (polycystic ovarian syndrome) 07/11/2019    Social History   Tobacco Use   Smoking status: Never   Smokeless tobacco: Never  Substance Use Topics   Alcohol use: Yes    Comment: very rarely    Current Outpatient Medications:    FLUoxetine (PROZAC) 20 MG capsule, Take 1 capsule (20 mg total) by mouth daily., Disp: 90 capsule, Rfl:  1  No Known Allergies  OBJECTIVE: BP 112/80   Pulse 74   Temp 97.8 F (36.6 C)   Wt 191 lb (86.6 kg)   SpO2 100%   BMI 34.93 kg/m  Physical Exam Vitals and nursing note reviewed.  Constitutional:      General: She is not in acute distress.    Appearance: Normal appearance. She is obese. She is not ill-appearing or toxic-appearing.  HENT:     Head: Normocephalic and atraumatic.  Eyes:     General: No scleral icterus.       Right eye: No discharge.        Left eye: No discharge.     Extraocular Movements: Extraocular movements intact.     Conjunctiva/sclera: Conjunctivae normal.     Pupils: Pupils are equal, round, and reactive to light.  Skin:    Findings: No rash.  Neurological:     Mental Status: She is alert and oriented to person, place, and time. Mental status is at baseline.     Motor: No weakness.     Coordination: Coordination normal.     Gait: Gait normal.  Psychiatric:         Mood and Affect: Mood normal.        Behavior: Behavior normal.        Thought Content: Thought content normal.        Judgment: Judgment normal.      ASSESSMENT AND PLAN: Jacqueline Davidson is a 37 y.o. female present for  Anxiety/post partum-  Stable, but could use more coverage for anxiety Increase Prozac 40 mg daily. F/u 5.5 mos, sooner if needed  Obesity: Pt expressed interest in weight loss counseling today. We discussed weight loss counseling appts and requirements. She will schedule if electing to start counseling.  Howard Pouch, DO 07/02/2022   No follow-ups on file.  No orders of the defined types were placed in this encounter.   No orders of the defined types were placed in this encounter.   Referral Orders  No referral(s) requested today

## 2022-07-09 ENCOUNTER — Other Ambulatory Visit: Payer: BC Managed Care – PPO

## 2022-07-21 ENCOUNTER — Ambulatory Visit
Admission: RE | Admit: 2022-07-21 | Discharge: 2022-07-21 | Disposition: A | Discharge status: Home / Self Care | Payer: BC Managed Care – PPO | Source: Ambulatory Visit | Attending: Obstetrics and Gynecology | Admitting: Obstetrics and Gynecology

## 2022-07-21 ENCOUNTER — Other Ambulatory Visit: Payer: Self-pay | Admitting: Family Medicine

## 2022-07-21 ENCOUNTER — Encounter: Payer: Self-pay | Admitting: Family Medicine

## 2022-07-21 DIAGNOSIS — R928 Other abnormal and inconclusive findings on diagnostic imaging of breast: Secondary | ICD-10-CM

## 2022-07-21 DIAGNOSIS — N6321 Unspecified lump in the left breast, upper outer quadrant: Secondary | ICD-10-CM | POA: Diagnosis not present

## 2022-10-30 ENCOUNTER — Other Ambulatory Visit (HOSPITAL_BASED_OUTPATIENT_CLINIC_OR_DEPARTMENT_OTHER): Payer: Self-pay

## 2022-10-30 DIAGNOSIS — E6609 Other obesity due to excess calories: Secondary | ICD-10-CM | POA: Diagnosis not present

## 2022-10-30 DIAGNOSIS — Z3200 Encounter for pregnancy test, result unknown: Secondary | ICD-10-CM | POA: Diagnosis not present

## 2022-10-30 DIAGNOSIS — E7800 Pure hypercholesterolemia, unspecified: Secondary | ICD-10-CM | POA: Diagnosis not present

## 2022-10-30 DIAGNOSIS — R0602 Shortness of breath: Secondary | ICD-10-CM | POA: Diagnosis not present

## 2022-10-30 DIAGNOSIS — Z1339 Encounter for screening examination for other mental health and behavioral disorders: Secondary | ICD-10-CM | POA: Diagnosis not present

## 2022-10-30 DIAGNOSIS — E88819 Insulin resistance, unspecified: Secondary | ICD-10-CM | POA: Diagnosis not present

## 2022-10-30 DIAGNOSIS — E559 Vitamin D deficiency, unspecified: Secondary | ICD-10-CM | POA: Diagnosis not present

## 2022-10-30 DIAGNOSIS — R635 Abnormal weight gain: Secondary | ICD-10-CM | POA: Diagnosis not present

## 2022-10-30 MED ORDER — WEGOVY 0.25 MG/0.5ML ~~LOC~~ SOAJ
0.2500 mg | SUBCUTANEOUS | 0 refills | Status: DC
  Filled 2022-10-30 – 2023-02-07 (×2): qty 2, 28d supply, fill #0

## 2022-11-10 ENCOUNTER — Other Ambulatory Visit (HOSPITAL_BASED_OUTPATIENT_CLINIC_OR_DEPARTMENT_OTHER): Payer: Self-pay

## 2022-11-10 MED ORDER — PHENTERMINE HCL 37.5 MG PO TABS
37.5000 mg | ORAL_TABLET | Freq: Every day | ORAL | 0 refills | Status: DC
  Filled 2022-11-10: qty 30, 30d supply, fill #0

## 2022-11-28 ENCOUNTER — Other Ambulatory Visit (HOSPITAL_BASED_OUTPATIENT_CLINIC_OR_DEPARTMENT_OTHER): Payer: Self-pay

## 2022-11-28 DIAGNOSIS — E88819 Insulin resistance, unspecified: Secondary | ICD-10-CM | POA: Diagnosis not present

## 2022-11-28 DIAGNOSIS — E559 Vitamin D deficiency, unspecified: Secondary | ICD-10-CM | POA: Diagnosis not present

## 2022-11-28 DIAGNOSIS — E7800 Pure hypercholesterolemia, unspecified: Secondary | ICD-10-CM | POA: Diagnosis not present

## 2022-11-28 DIAGNOSIS — E6609 Other obesity due to excess calories: Secondary | ICD-10-CM | POA: Diagnosis not present

## 2022-11-28 MED ORDER — PHENTERMINE HCL 37.5 MG PO TABS
37.5000 mg | ORAL_TABLET | Freq: Every day | ORAL | 0 refills | Status: DC
  Filled 2022-11-28 – 2022-12-12 (×3): qty 30, 30d supply, fill #0

## 2022-11-28 MED ORDER — VITAMIN D (ERGOCALCIFEROL) 1.25 MG (50000 UNIT) PO CAPS
50000.0000 [IU] | ORAL_CAPSULE | ORAL | 5 refills | Status: AC
  Filled 2022-11-28: qty 4, 28d supply, fill #0
  Filled 2022-12-26 – 2023-01-20 (×3): qty 4, 28d supply, fill #1
  Filled 2023-02-19: qty 4, 28d supply, fill #2
  Filled 2023-03-25: qty 4, 28d supply, fill #3
  Filled 2023-11-11: qty 4, 28d supply, fill #4

## 2022-12-01 ENCOUNTER — Other Ambulatory Visit (HOSPITAL_BASED_OUTPATIENT_CLINIC_OR_DEPARTMENT_OTHER): Payer: Self-pay

## 2022-12-12 ENCOUNTER — Other Ambulatory Visit (HOSPITAL_BASED_OUTPATIENT_CLINIC_OR_DEPARTMENT_OTHER): Payer: Self-pay

## 2022-12-12 ENCOUNTER — Other Ambulatory Visit: Payer: Self-pay

## 2022-12-17 ENCOUNTER — Ambulatory Visit: Payer: BC Managed Care – PPO | Admitting: Family Medicine

## 2022-12-27 ENCOUNTER — Other Ambulatory Visit (HOSPITAL_BASED_OUTPATIENT_CLINIC_OR_DEPARTMENT_OTHER): Payer: Self-pay

## 2022-12-30 ENCOUNTER — Other Ambulatory Visit (HOSPITAL_BASED_OUTPATIENT_CLINIC_OR_DEPARTMENT_OTHER): Payer: Self-pay

## 2022-12-30 DIAGNOSIS — E559 Vitamin D deficiency, unspecified: Secondary | ICD-10-CM | POA: Diagnosis not present

## 2022-12-30 DIAGNOSIS — E88819 Insulin resistance, unspecified: Secondary | ICD-10-CM | POA: Diagnosis not present

## 2022-12-30 DIAGNOSIS — Z79899 Other long term (current) drug therapy: Secondary | ICD-10-CM | POA: Diagnosis not present

## 2022-12-30 DIAGNOSIS — E7800 Pure hypercholesterolemia, unspecified: Secondary | ICD-10-CM | POA: Diagnosis not present

## 2022-12-30 DIAGNOSIS — E6609 Other obesity due to excess calories: Secondary | ICD-10-CM | POA: Diagnosis not present

## 2022-12-30 MED ORDER — PHENTERMINE HCL 37.5 MG PO TABS
37.5000 mg | ORAL_TABLET | Freq: Every day | ORAL | 0 refills | Status: DC
  Filled 2022-12-30 – 2023-01-20 (×2): qty 30, 30d supply, fill #0

## 2023-01-05 ENCOUNTER — Other Ambulatory Visit (HOSPITAL_BASED_OUTPATIENT_CLINIC_OR_DEPARTMENT_OTHER): Payer: Self-pay

## 2023-01-14 DIAGNOSIS — Z01419 Encounter for gynecological examination (general) (routine) without abnormal findings: Secondary | ICD-10-CM | POA: Diagnosis not present

## 2023-01-14 DIAGNOSIS — Z6831 Body mass index (BMI) 31.0-31.9, adult: Secondary | ICD-10-CM | POA: Diagnosis not present

## 2023-01-14 DIAGNOSIS — Z124 Encounter for screening for malignant neoplasm of cervix: Secondary | ICD-10-CM | POA: Diagnosis not present

## 2023-01-14 LAB — HM PAP SMEAR

## 2023-01-14 LAB — RESULTS CONSOLE HPV: CHL HPV: NEGATIVE

## 2023-01-20 ENCOUNTER — Other Ambulatory Visit (HOSPITAL_BASED_OUTPATIENT_CLINIC_OR_DEPARTMENT_OTHER): Payer: Self-pay

## 2023-01-20 ENCOUNTER — Other Ambulatory Visit: Payer: Self-pay

## 2023-02-06 ENCOUNTER — Other Ambulatory Visit (HOSPITAL_BASED_OUTPATIENT_CLINIC_OR_DEPARTMENT_OTHER): Payer: Self-pay

## 2023-02-06 DIAGNOSIS — E6609 Other obesity due to excess calories: Secondary | ICD-10-CM | POA: Diagnosis not present

## 2023-02-06 DIAGNOSIS — Z79899 Other long term (current) drug therapy: Secondary | ICD-10-CM | POA: Diagnosis not present

## 2023-02-06 DIAGNOSIS — E7800 Pure hypercholesterolemia, unspecified: Secondary | ICD-10-CM | POA: Diagnosis not present

## 2023-02-06 DIAGNOSIS — E669 Obesity, unspecified: Secondary | ICD-10-CM | POA: Diagnosis not present

## 2023-02-06 DIAGNOSIS — E559 Vitamin D deficiency, unspecified: Secondary | ICD-10-CM | POA: Diagnosis not present

## 2023-02-06 MED ORDER — PHENTERMINE HCL 37.5 MG PO TABS
37.5000 mg | ORAL_TABLET | Freq: Every day | ORAL | 0 refills | Status: DC
  Filled 2023-02-06 – 2023-02-19 (×2): qty 30, 30d supply, fill #0

## 2023-02-07 ENCOUNTER — Other Ambulatory Visit (HOSPITAL_BASED_OUTPATIENT_CLINIC_OR_DEPARTMENT_OTHER): Payer: Self-pay

## 2023-02-13 ENCOUNTER — Other Ambulatory Visit (HOSPITAL_BASED_OUTPATIENT_CLINIC_OR_DEPARTMENT_OTHER): Payer: Self-pay

## 2023-02-19 ENCOUNTER — Encounter (HOSPITAL_BASED_OUTPATIENT_CLINIC_OR_DEPARTMENT_OTHER): Payer: Self-pay

## 2023-02-20 ENCOUNTER — Other Ambulatory Visit (HOSPITAL_BASED_OUTPATIENT_CLINIC_OR_DEPARTMENT_OTHER): Payer: Self-pay

## 2023-02-22 ENCOUNTER — Other Ambulatory Visit (HOSPITAL_BASED_OUTPATIENT_CLINIC_OR_DEPARTMENT_OTHER): Payer: Self-pay

## 2023-03-04 ENCOUNTER — Other Ambulatory Visit (HOSPITAL_BASED_OUTPATIENT_CLINIC_OR_DEPARTMENT_OTHER): Payer: Self-pay

## 2023-03-06 ENCOUNTER — Other Ambulatory Visit (HOSPITAL_BASED_OUTPATIENT_CLINIC_OR_DEPARTMENT_OTHER): Payer: Self-pay

## 2023-03-06 DIAGNOSIS — E6609 Other obesity due to excess calories: Secondary | ICD-10-CM | POA: Diagnosis not present

## 2023-03-06 DIAGNOSIS — Z79899 Other long term (current) drug therapy: Secondary | ICD-10-CM | POA: Diagnosis not present

## 2023-03-06 DIAGNOSIS — E559 Vitamin D deficiency, unspecified: Secondary | ICD-10-CM | POA: Diagnosis not present

## 2023-03-06 DIAGNOSIS — Z6832 Body mass index (BMI) 32.0-32.9, adult: Secondary | ICD-10-CM | POA: Diagnosis not present

## 2023-03-06 DIAGNOSIS — E66811 Obesity, class 1: Secondary | ICD-10-CM | POA: Diagnosis not present

## 2023-03-06 DIAGNOSIS — E7800 Pure hypercholesterolemia, unspecified: Secondary | ICD-10-CM | POA: Diagnosis not present

## 2023-03-06 MED ORDER — PHENTERMINE HCL 37.5 MG PO TABS
37.5000 mg | ORAL_TABLET | Freq: Every day | ORAL | 0 refills | Status: DC
  Filled 2023-03-06 – 2023-03-31 (×2): qty 30, 30d supply, fill #0

## 2023-03-15 ENCOUNTER — Other Ambulatory Visit (HOSPITAL_BASED_OUTPATIENT_CLINIC_OR_DEPARTMENT_OTHER): Payer: Self-pay

## 2023-03-16 ENCOUNTER — Telehealth (INDEPENDENT_AMBULATORY_CARE_PROVIDER_SITE_OTHER): Payer: BC Managed Care – PPO | Admitting: Family Medicine

## 2023-03-16 ENCOUNTER — Encounter: Payer: Self-pay | Admitting: Family Medicine

## 2023-03-16 ENCOUNTER — Other Ambulatory Visit (HOSPITAL_BASED_OUTPATIENT_CLINIC_OR_DEPARTMENT_OTHER): Payer: Self-pay

## 2023-03-16 VITALS — Ht 62.0 in | Wt 175.0 lb

## 2023-03-16 DIAGNOSIS — F419 Anxiety disorder, unspecified: Secondary | ICD-10-CM

## 2023-03-16 MED ORDER — FLUOXETINE HCL 40 MG PO CAPS: 40.0000 mg | ORAL_CAPSULE | Freq: Every day | ORAL | 1 refills | Status: DC

## 2023-03-16 NOTE — Progress Notes (Signed)
VIRTUAL VISIT VIA VIDEO  I connected with [name] on 03/16/23 at  1:20 PM EDT by a video enabled telemedicine application and verified that I am speaking with the correct person using two identifiers. Location patient: Home Location provider: [facility], Office Persons participating in the virtual visit: Patient, Dr. Claiborne Billings and Ivonne Andrew, CMA  I discussed the limitations of evaluation and management by telemedicine and the availability of in person appointments. The patient expressed understanding and agreed to proceed.   .   Patient Care Team    Relationship Specialty Notifications Start End  Natalia Leatherwood, DO PCP - General Family Medicine  07/11/19   Genelle Gather, OD  Optometry  07/12/19   Sherrian Divers, PA  Obstetrics and Gynecology  07/12/19      SUBJECTIVE Chief Complaint  Patient presents with   Anxiety    HPI: Jacqueline Davidson is a 37 y.o. female present to Chronic Conditions/illness Management  Anxiety:  Patient reports prozac 40 mg is working well for her. She feels more leveled out. Sitting on porch watching her kids play today. Prior note: pt reports she has struggled w/ anxiety the majority of her adult life. 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 She reports she can lose her appetite, lose sleep  and cause her kids to have doctors appt/evaluations they may not need because of her worry. She has never sought therapy or medication treatment for her anxiousness in the past. She states she had her 3rd child 9 weeks ago and is feeling overwhelmed trying to adjust to motherhood of three young children and she is more irritable. She is seeking treatment options today. She is concerned she is being unfair to her kids/family with her anxiousness now.      03/16/2023    1:16 PM 07/02/2022    8:40 AM 08/12/2021    2:03 PM 03/13/2021   10:48 AM  GAD 7 : Generalized Anxiety  Score  Nervous, Anxious, on Edge 0 0 2 2  Control/stop worrying 0 2 3 2   Worry too much - different things 0 2 1 0  Trouble relaxing 0 0 0 1  Restless 0 0 0 0  Easily annoyed or irritable 1 2 1 3   Afraid - awful might happen 1 1 1  0  Total GAD 7 Score 2 7 8 8   Anxiety Difficulty Not difficult at all          03/16/2023    1:16 PM 07/02/2022    8:38 AM 08/12/2021    2:01 PM 03/13/2021   10:47 AM 07/11/2019    3:39 PM  Depression screen PHQ 2/9  Decreased Interest 0 1 1 1  0  Down, Depressed, Hopeless 0 1 2 1  0  PHQ - 2 Score 0 2 3 2  0  Altered sleeping  0 0 1   Tired, decreased energy  3 3 1    Change in appetite  2 0 0   Feeling bad or failure about yourself   0 0 0   Trouble concentrating  2 3 0   Moving slowly or fidgety/restless  0 0 0   Suicidal thoughts  0 0 0   PHQ-9 Score  9 9 4       ROS: See pertinent positives and negatives per HPI.  Patient Active Problem List   Diagnosis Date Noted   Obesity (BMI 30.0-34.9) 07/02/2022  Anxiety 07/18/2019   Gastroesophageal reflux disease 07/18/2019   Vitamin D deficiency 07/12/2019   PCOS (polycystic ovarian syndrome) 07/11/2019    Social History   Tobacco Use   Smoking status: Never   Smokeless tobacco: Never  Substance Use Topics   Alcohol use: Yes    Comment: very rarely    Current Outpatient Medications:    phentermine (ADIPEX-P) 37.5 MG tablet, Take 1 tablet (37.5 mg total) by mouth daily., Disp: 30 tablet, Rfl: 0   Vitamin D, Ergocalciferol, (DRISDOL) 1.25 MG (50000 UNIT) CAPS capsule, Take 1 capsule (50,000 Units total) by mouth once a week., Disp: 4 capsule, Rfl: 5   FLUoxetine (PROZAC) 40 MG capsule, Take 1 capsule (40 mg total) by mouth daily., Disp: 90 capsule, Rfl: 1  No Known Allergies  OBJECTIVE: Ht 5\' 2"  (1.575 m)   Wt 175 lb (79.4 kg)   LMP 03/13/2023   Breastfeeding Unknown   BMI 32.01 kg/m  Physical Exam Vitals and nursing note reviewed.  Constitutional:      General: She is not in acute  distress.    Appearance: Normal appearance. She is not ill-appearing or toxic-appearing.  HENT:     Head: Normocephalic and atraumatic.  Eyes:     General: No scleral icterus.       Right eye: No discharge.        Left eye: No discharge.     Extraocular Movements: Extraocular movements intact.     Conjunctiva/sclera: Conjunctivae normal.     Pupils: Pupils are equal, round, and reactive to light.  Neurological:     Mental Status: She is alert and oriented to person, place, and time. Mental status is at baseline.     Motor: No weakness.     Coordination: Coordination normal.     Gait: Gait normal.  Psychiatric:        Mood and Affect: Mood normal.        Behavior: Behavior normal.        Thought Content: Thought content normal.        Judgment: Judgment normal.      ASSESSMENT AND PLAN: Jacqueline Davidson is a 37 y.o. female present for  Anxiety stable Continue  Prozac 40 mg daily. F/u 5.5 mos, sooner if needed  Felix Pacini, DO 03/16/2023   Return in about 24 weeks (around 08/31/2023) for cpe (20 min), Routine chronic condition follow-up.  No orders of the defined types were placed in this encounter.   Meds ordered this encounter  Medications   FLUoxetine (PROZAC) 40 MG capsule    Sig: Take 1 capsule (40 mg total) by mouth daily.    Dispense:  90 capsule    Refill:  1    May use caps or tabs- formulary    Referral Orders  No referral(s) requested today

## 2023-03-16 NOTE — Patient Instructions (Signed)
Return in about 24 weeks (around 08/31/2023) for cpe (20 min), Routine chronic condition follow-up.        Great to see you today.  I have refilled the medication(s) we provide.   If labs were collected or images ordered, we will inform you of  results once we have received them and reviewed. We will contact you either by echart message, or telephone call.  Please give ample time to the testing facility, and our office to run,  receive and review results. Please do not call inquiring of results, even if you can see them in your chart. We will contact you as soon as we are able. If it has been over 1 week since the test was completed, and you have not yet heard from [place], then please call us.    - echart message- for normal results that have been seen by the patient already.   - telephone call: abnormal results or if patient has not viewed results in their echart.  If a referral to a specialist was entered for you, please call us in 2 weeks if you have not heard from the specialist office to schedule.

## 2023-03-31 ENCOUNTER — Other Ambulatory Visit: Payer: Self-pay

## 2023-03-31 ENCOUNTER — Other Ambulatory Visit (HOSPITAL_BASED_OUTPATIENT_CLINIC_OR_DEPARTMENT_OTHER): Payer: Self-pay

## 2023-04-07 ENCOUNTER — Other Ambulatory Visit (HOSPITAL_BASED_OUTPATIENT_CLINIC_OR_DEPARTMENT_OTHER): Payer: Self-pay

## 2023-04-10 ENCOUNTER — Other Ambulatory Visit (HOSPITAL_BASED_OUTPATIENT_CLINIC_OR_DEPARTMENT_OTHER): Payer: Self-pay

## 2023-04-10 DIAGNOSIS — Z6832 Body mass index (BMI) 32.0-32.9, adult: Secondary | ICD-10-CM | POA: Diagnosis not present

## 2023-04-10 DIAGNOSIS — E559 Vitamin D deficiency, unspecified: Secondary | ICD-10-CM | POA: Diagnosis not present

## 2023-04-10 DIAGNOSIS — E6609 Other obesity due to excess calories: Secondary | ICD-10-CM | POA: Diagnosis not present

## 2023-04-10 DIAGNOSIS — E7800 Pure hypercholesterolemia, unspecified: Secondary | ICD-10-CM | POA: Diagnosis not present

## 2023-04-10 DIAGNOSIS — E66811 Obesity, class 1: Secondary | ICD-10-CM | POA: Diagnosis not present

## 2023-04-10 MED ORDER — PHENTERMINE HCL 37.5 MG PO TABS
37.5000 mg | ORAL_TABLET | Freq: Every day | ORAL | 0 refills | Status: DC
  Filled 2023-06-01 – 2023-08-11 (×2): qty 30, 30d supply, fill #0

## 2023-06-01 ENCOUNTER — Other Ambulatory Visit: Payer: Self-pay

## 2023-06-01 ENCOUNTER — Other Ambulatory Visit (HOSPITAL_BASED_OUTPATIENT_CLINIC_OR_DEPARTMENT_OTHER): Payer: Self-pay

## 2023-06-11 ENCOUNTER — Other Ambulatory Visit (HOSPITAL_BASED_OUTPATIENT_CLINIC_OR_DEPARTMENT_OTHER): Payer: Self-pay

## 2023-08-11 ENCOUNTER — Other Ambulatory Visit (HOSPITAL_BASED_OUTPATIENT_CLINIC_OR_DEPARTMENT_OTHER): Payer: Self-pay

## 2023-08-19 ENCOUNTER — Other Ambulatory Visit (HOSPITAL_BASED_OUTPATIENT_CLINIC_OR_DEPARTMENT_OTHER): Payer: Self-pay

## 2023-08-28 ENCOUNTER — Ambulatory Visit: Admitting: Family Medicine

## 2023-10-30 ENCOUNTER — Encounter: Payer: Self-pay | Admitting: Family Medicine

## 2023-10-30 ENCOUNTER — Ambulatory Visit (INDEPENDENT_AMBULATORY_CARE_PROVIDER_SITE_OTHER): Admitting: Family Medicine

## 2023-10-30 VITALS — BP 114/78 | HR 81 | Temp 98.0°F | Wt 181.0 lb

## 2023-10-30 DIAGNOSIS — F419 Anxiety disorder, unspecified: Secondary | ICD-10-CM

## 2023-10-30 DIAGNOSIS — R41840 Attention and concentration deficit: Secondary | ICD-10-CM | POA: Diagnosis not present

## 2023-10-30 MED ORDER — GUANFACINE HCL ER 1 MG PO TB24: 1.0000 mg | ORAL_TABLET | Freq: Every day | ORAL | 5 refills | Status: DC

## 2023-10-30 MED ORDER — FLUOXETINE HCL 40 MG PO CAPS: 40.0000 mg | ORAL_CAPSULE | Freq: Every day | ORAL | 1 refills | Status: DC

## 2023-10-30 NOTE — Progress Notes (Signed)
 Patient Care Team    Relationship Specialty Notifications Start End  Mariel Shope, DO PCP - General Family Medicine  07/11/19   Horace Lye, OD  Optometry  07/12/19   Binnie Buffalo, PA  Obstetrics and Gynecology  07/12/19      SUBJECTIVE Chief Complaint  Patient presents with   Difficulty Focusing    Pt requesting medication to help her focus; states anxiety med does not help.    HPI: Jacqueline Davidson is a 38 y.o. female present to Chronic Conditions/illness Management and discuss attention concerns  Anxiety/ADD:  Patient reports prozac  40 mg is working well for her.  She did take a break from the medicine for a little while and has recently restarted.  She was having increased anxiety and waking up in the middle the night. She has a history of attention deficit and had been on Adderall/Ritalin during her high school years.  She states it did work, but made her a little too zoned in. She reports having difficulty staying on task and feels overwhelmed trying to multitask at work. Prior note: pt reports she has struggled w/ anxiety the majority of her adult life. 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 She reports she can lose her appetite, lose sleep  and cause her kids to have doctors appt/evaluations they may not need because of her worry. She has never sought therapy or medication treatment for her anxiousness in the past. She states she had her 3rd child 9 weeks ago and is feeling overwhelmed trying to adjust to motherhood of three young children and she is more irritable. She is seeking treatment options today. She is concerned she is being unfair to her kids/family with her anxiousness now.      03/16/2023    1:16 PM 07/02/2022    8:40 AM 08/12/2021    2:03 PM 03/13/2021   10:48 AM  GAD 7 : Generalized Anxiety Score  Nervous, Anxious, on Edge 0 0 2 2  Control/stop worrying 0 2 3 2   Worry too much -  different things 0 2 1 0  Trouble relaxing 0 0 0 1  Restless 0 0 0 0  Easily annoyed or irritable 1 2 1 3   Afraid - awful might happen 1 1 1  0  Total GAD 7 Score 2 7 8 8   Anxiety Difficulty Not difficult at all          03/16/2023    1:16 PM 07/02/2022    8:38 AM 08/12/2021    2:01 PM 03/13/2021   10:47 AM 07/11/2019    3:39 PM  Depression screen PHQ 2/9  Decreased Interest 0 1 1 1  0  Down, Depressed, Hopeless 0 1 2 1  0  PHQ - 2 Score 0 2 3 2  0  Altered sleeping  0 0 1   Tired, decreased energy  3 3 1    Change in appetite  2 0 0   Feeling bad or failure about yourself   0 0 0   Trouble concentrating  2 3 0   Moving slowly or fidgety/restless  0 0 0   Suicidal thoughts  0 0 0   PHQ-9 Score  9 9 4       ROS: See pertinent positives and negatives per HPI.  Patient Active Problem List   Diagnosis Date Noted   Obesity (BMI 30.0-34.9) 07/02/2022   Anxiety 07/18/2019   Gastroesophageal  reflux disease 07/18/2019   Vitamin D  deficiency 07/12/2019   PCOS (polycystic ovarian syndrome) 07/11/2019    Social History   Tobacco Use   Smoking status: Never   Smokeless tobacco: Never  Substance Use Topics   Alcohol  use: Yes    Comment: very rarely    Current Outpatient Medications:    guanFACINE  (INTUNIV ) 1 MG TB24 ER tablet, Take 1 tablet (1 mg total) by mouth daily., Disp: 30 tablet, Rfl: 5   Vitamin D , Ergocalciferol , (DRISDOL ) 1.25 MG (50000 UNIT) CAPS capsule, Take 1 capsule (50,000 Units total) by mouth once a week., Disp: 4 capsule, Rfl: 5   FLUoxetine  (PROZAC ) 40 MG capsule, Take 1 capsule (40 mg total) by mouth daily., Disp: 90 capsule, Rfl: 1  No Known Allergies  OBJECTIVE: BP 114/78   Pulse 81   Temp 98 F (36.7 C)   Wt 181 lb (82.1 kg)   SpO2 96%   BMI 33.11 kg/m  Physical Exam Vitals and nursing note reviewed.  Constitutional:      General: She is not in acute distress.    Appearance: Normal appearance. She is not ill-appearing or toxic-appearing.  HENT:      Head: Normocephalic and atraumatic.  Eyes:     General: No scleral icterus.       Right eye: No discharge.        Left eye: No discharge.     Extraocular Movements: Extraocular movements intact.     Conjunctiva/sclera: Conjunctivae normal.     Pupils: Pupils are equal, round, and reactive to light.  Neurological:     Mental Status: She is alert and oriented to person, place, and time. Mental status is at baseline.     Motor: No weakness.     Coordination: Coordination normal.     Gait: Gait normal.  Psychiatric:        Mood and Affect: Mood normal.        Behavior: Behavior normal.        Thought Content: Thought content normal.        Judgment: Judgment normal.      ASSESSMENT AND PLAN: Jacqueline Davidson is a 38 y.o. female present for  Anxiety/ADD Continue Prozac  40 mg daily. Start intuniv  taper to 1 mg qhs.   We discussed caution on stimulant class with her nightly and difficulty sleeping.  Therefore we will try Intuniv  first. Follow-up in 4 weeks can be virtual  Napolean Backbone, DO 10/30/2023   Return in about 4 weeks (around 11/27/2023) for can be virtual- add.  No orders of the defined types were placed in this encounter.   Meds ordered this encounter  Medications   guanFACINE  (INTUNIV ) 1 MG TB24 ER tablet    Sig: Take 1 tablet (1 mg total) by mouth daily.    Dispense:  30 tablet    Refill:  5   FLUoxetine  (PROZAC ) 40 MG capsule    Sig: Take 1 capsule (40 mg total) by mouth daily.    Dispense:  90 capsule    Refill:  1    May use caps or tabs- formulary    Referral Orders  No referral(s) requested today

## 2023-10-30 NOTE — Patient Instructions (Addendum)
 Return in about 4 weeks (around 11/27/2023) for can be virtual- add.        Great to see you today.  I have refilled the medication(s) we provide.   If labs were collected or images ordered, we will inform you of  results once we have received them and reviewed. We will contact you either by echart message, or telephone call.  Please give ample time to the testing facility, and our office to run,  receive and review results. Please do not call inquiring of results, even if you can see them in your chart. We will contact you as soon as we are able. If it has been over 1 week since the test was completed, and you have not yet heard from us , then please call us .    - echart message- for normal results that have been seen by the patient already.   - telephone call: abnormal results or if patient has not viewed results in their echart.  If a referral to a specialist was entered for you, please call us  in 2 weeks if you have not heard from the specialist office to schedule.

## 2023-11-21 ENCOUNTER — Other Ambulatory Visit (HOSPITAL_BASED_OUTPATIENT_CLINIC_OR_DEPARTMENT_OTHER): Payer: Self-pay

## 2023-11-22 ENCOUNTER — Other Ambulatory Visit: Payer: Self-pay | Admitting: Family Medicine

## 2023-11-27 ENCOUNTER — Telehealth (INDEPENDENT_AMBULATORY_CARE_PROVIDER_SITE_OTHER): Admitting: Family Medicine

## 2023-11-27 ENCOUNTER — Encounter: Payer: Self-pay | Admitting: Family Medicine

## 2023-11-27 DIAGNOSIS — R41840 Attention and concentration deficit: Secondary | ICD-10-CM

## 2023-11-27 DIAGNOSIS — F419 Anxiety disorder, unspecified: Secondary | ICD-10-CM

## 2023-11-27 MED ORDER — AMPHETAMINE-DEXTROAMPHETAMINE 10 MG PO TABS: 5.0000 mg | ORAL_TABLET | Freq: Two times a day (BID) | ORAL | 0 refills | Status: DC

## 2023-11-27 NOTE — Patient Instructions (Addendum)
 Return in about 11 weeks (around 02/12/2024) for Routine chronic condition follow-up.        Great to see you today.  I have refilled the medication(s) we provide.   If labs were collected or images ordered, we will inform you of  results once we have received them and reviewed. We will contact you either by echart message, or telephone call.  Please give ample time to the testing facility, and our office to run,  receive and review results. Please do not call inquiring of results, even if you can see them in your chart. We will contact you as soon as we are able. If it has been over 1 week since the test was completed, and you have not yet heard from us , then please call us .    - echart message- for normal results that have been seen by the patient already.   - telephone call: abnormal results or if patient has not viewed results in their echart.  If a referral to a specialist was entered for you, please call us  in 2 weeks if you have not heard from the specialist office to schedule.

## 2023-11-27 NOTE — Progress Notes (Addendum)
 VIRTUAL VISIT VIA VIDEO  I connected with Kripa Foskey Rippeon on 11/27/23 at  1:20 PM EDT by a video enabled telemedicine application and verified that I am speaking with the correct person using two identifiers. Location patient: Home Location provider: [facility], Office Persons participating in the virtual visit: Patient, Dr. Catherine and ALONSO Sharps, CMA  I discussed the limitations of evaluation and management by telemedicine and the availability of in person appointments. The patient expressed understanding and agreed to proceed.    Patient Care Team    Relationship Specialty Notifications Start End  Catherine Charlies LABOR, DO PCP - General Family Medicine  07/11/19   Brinda Elsie BRAVO, OD  Optometry  07/12/19   Lenon Damien LABOR, PA  Obstetrics and Gynecology  07/12/19      SUBJECTIVE Chief Complaint  Patient presents with   Anxiety    HPI: Jacqueline Davidson is a 38 y.o. female present to Chronic Conditions/illness Management and discuss attention concerns  Anxiety/ADD: Patient reports she is feeling better since restarting back on the Prozac .  She does not feel the guanfacine  is helping at all for her attention deficit. She has a history of attention deficit and had been on Adderall/Ritalin during her high school years.  She states it did work, but made her a little too zoned in. She reports having difficulty staying on task and feels overwhelmed trying to multitask at work. She reports she recently was prescribed phentermine  and did not have increased anxiety with it.  Prior note: pt reports she has struggled w/ anxiety the majority of her adult life. She reports she is a Product/process development scientist. She admits she worries about her kids health and finances frequently. She reports she can lose her appetite, lose sleep  and cause her kids to have doctors appt/evaluations they may not need because of her worry. She has never sought therapy or medication treatment for her anxiousness in the  past. She states she had her 3rd child 9 weeks ago and is feeling overwhelmed trying to adjust to motherhood of three young children and she is more irritable. She is seeking treatment options today. She is concerned she is being unfair to her kids/family with her anxiousness now.      03/16/2023    1:16 PM 07/02/2022    8:40 AM 08/12/2021    2:03 PM 03/13/2021   10:48 AM  GAD 7 : Generalized Anxiety Score  Nervous, Anxious, on Edge 0 0 2 2  Control/stop worrying 0 2 3 2   Worry too much - different things 0 2 1 0  Trouble relaxing 0 0 0 1  Restless 0 0 0 0  Easily annoyed or irritable 1 2 1 3   Afraid - awful might happen 1 1 1  0  Total GAD 7 Score 2 7 8 8   Anxiety Difficulty Not difficult at all          03/16/2023    1:16 PM 07/02/2022    8:38 AM 08/12/2021    2:01 PM 03/13/2021   10:47 AM 07/11/2019    3:39 PM  Depression screen PHQ 2/9  Decreased Interest 0 1 1 1  0  Down, Depressed, Hopeless 0 1 2 1  0  PHQ - 2 Score 0 2 3 2  0  Altered sleeping  0 0 1   Tired, decreased energy  3 3 1    Change in appetite  2 0 0   Feeling bad or failure about yourself  0 0 0   Trouble concentrating  2 3 0   Moving slowly or fidgety/restless  0 0 0   Suicidal thoughts  0 0 0   PHQ-9 Score  9 9 4       ROS: See pertinent positives and negatives per HPI.  Patient Active Problem List   Diagnosis Date Noted   Attention or concentration deficit 10/30/2023   Obesity (BMI 30.0-34.9) 07/02/2022   Anxiety 07/18/2019   Gastroesophageal reflux disease 07/18/2019   Vitamin D  deficiency 07/12/2019   PCOS (polycystic ovarian syndrome) 07/11/2019    Social History   Tobacco Use   Smoking status: Never   Smokeless tobacco: Never  Substance Use Topics   Alcohol  use: Yes    Comment: very rarely    Current Outpatient Medications:    amphetamine-dextroamphetamine (ADDERALL) 10 MG tablet, Take 0.5 tablets (5 mg total) by mouth 2 (two) times daily., Disp: 90 tablet, Rfl: 0   FLUoxetine  (PROZAC )  40 MG capsule, Take 1 capsule (40 mg total) by mouth daily., Disp: 90 capsule, Rfl: 1   guanFACINE  (INTUNIV ) 1 MG TB24 ER tablet, Take 1 tablet (1 mg total) by mouth daily., Disp: 30 tablet, Rfl: 5   Vitamin D , Ergocalciferol , (DRISDOL ) 1.25 MG (50000 UNIT) CAPS capsule, Take 1 capsule (50,000 Units total) by mouth once a week., Disp: 4 capsule, Rfl: 5  No Known Allergies  OBJECTIVE: There were no vitals taken for this visit. Physical Exam Vitals and nursing note reviewed.  Constitutional:      General: She is not in acute distress.    Appearance: Normal appearance. She is not toxic-appearing.  HENT:     Head: Normocephalic and atraumatic.   Eyes:     General: No scleral icterus.       Right eye: No discharge.        Left eye: No discharge.     Conjunctiva/sclera: Conjunctivae normal.   Pulmonary:     Effort: Pulmonary effort is normal.   Musculoskeletal:     Cervical back: Normal range of motion.   Skin:    Findings: No rash.   Neurological:     Mental Status: She is alert and oriented to person, place, and time. Mental status is at baseline.   Psychiatric:        Mood and Affect: Mood normal.        Behavior: Behavior normal.        Thought Content: Thought content normal.        Judgment: Judgment normal.      ASSESSMENT AND PLAN: Charee Tumblin is a 38 y.o. female present for  Anxiety/ADD Continue Prozac  40 mg daily.  Anxiety is well-controlled. DC intuniv , we could consider tapering up if patient decided she wanted to try a stimulant class-despite her worries about Adderall in the past. Adderall 5 mg twice daily prescribed.  Patient was encouraged to make sure the second daily dose is before 1 PM. [place]  controlled substance database reviewed and appropriate today Follow-up 3 months, sooner if needed  [facility], DO 11/27/2023   Return in about 11 weeks (around 02/12/2024) for Routine chronic condition follow-up.  No orders of the defined  types were placed in this encounter.   Meds ordered this encounter  Medications   amphetamine-dextroamphetamine (ADDERALL) 10 MG tablet    Sig: Take 0.5 tablets (5 mg total) by mouth 2 (two) times daily.    Dispense:  90 tablet    Refill:  0  Referral Orders  No referral(s) requested today

## 2024-04-11 ENCOUNTER — Ambulatory Visit: Admitting: Family Medicine

## 2024-04-11 ENCOUNTER — Telehealth: Admitting: Family Medicine

## 2024-04-11 ENCOUNTER — Encounter: Payer: Self-pay | Admitting: Family Medicine

## 2024-04-11 DIAGNOSIS — R41840 Attention and concentration deficit: Secondary | ICD-10-CM

## 2024-04-11 DIAGNOSIS — F419 Anxiety disorder, unspecified: Secondary | ICD-10-CM

## 2024-04-11 MED ORDER — AMPHETAMINE-DEXTROAMPHETAMINE 10 MG PO TABS: 5.0000 mg | ORAL_TABLET | Freq: Three times a day (TID) | ORAL | 0 refills | Status: DC

## 2024-04-11 MED ORDER — FLUOXETINE HCL 40 MG PO CAPS: 40.0000 mg | ORAL_CAPSULE | Freq: Every day | ORAL | 1 refills | Status: AC

## 2024-04-11 NOTE — Progress Notes (Signed)
 VIRTUAL VISIT VIA VIDEO  I connected with Jacqueline Davidson on 04/11/24 at 11:20 AM EST by a video enabled telemedicine application and verified that I am speaking with the correct person using two identifiers. Location patient: Home Location provider: [facility], Office Persons participating in the virtual visit: Patient, Dr. Catherine and ALONSO Sharps, CMA  I discussed the limitations of evaluation and management by telemedicine and the availability of in person appointments. The patient expressed understanding and agreed to proceed.    Patient Care Team    Relationship Specialty Notifications Start End  Catherine Charlies LABOR, DO PCP - General Family Medicine  07/11/19   Brinda Elsie BRAVO, OD  Optometry  07/12/19   Lenon Damien LABOR, PA  Obstetrics and Gynecology  07/12/19      SUBJECTIVE Chief Complaint  Patient presents with   Anxiety    HPI: Jacqueline Davidson is a 38 y.o. female present to Chronic Conditions/illness Management and discuss attention concerns  Anxiety/ADD: Patient reports compliance with Prozac  use daily.  She feels medication is helping, but she feels it wears off too soon.  Her day starts about 8:00 and goes till 6:30 PM.  She is taking the morning dose around 830 and the afternoon dose around 1230 and she feels like each time it wears off about 3 hours.  Prior note: She has a history of attention deficit and had been on Adderall/Ritalin during her high school years.  She states it did work, but made her a little too zoned in. She reports having difficulty staying on task and feels overwhelmed trying to multitask at work. She reports she recently was prescribed phentermine  and did not have increased anxiety with it.  Prior note: pt reports she has struggled w/ anxiety the majority of her adult life. She reports she is a product/process development scientist. She admits she worries about her kids health and finances frequently. She reports she can lose her appetite, lose sleep  and  cause her kids to have doctors appt/evaluations they may not need because of her worry. She has never sought therapy or medication treatment for her anxiousness in the past. She states she had her 3rd child 9 weeks ago and is feeling overwhelmed trying to adjust to motherhood of three young children and she is more irritable. She is seeking treatment options today. She is concerned she is being unfair to her kids/family with her anxiousness now.      03/16/2023    1:16 PM 07/02/2022    8:40 AM 08/12/2021    2:03 PM 03/13/2021   10:48 AM  GAD 7 : Generalized Anxiety Score  Nervous, Anxious, on Edge 0 0 2 2  Control/stop worrying 0 2 3 2   Worry too much - different things 0 2 1 0  Trouble relaxing 0 0 0 1  Restless 0 0 0 0  Easily annoyed or irritable 1 2 1 3   Afraid - awful might happen 1 1 1  0  Total GAD 7 Score 2 7 8 8   Anxiety Difficulty Not difficult at all          03/16/2023    1:16 PM 07/02/2022    8:38 AM 08/12/2021    2:01 PM 03/13/2021   10:47 AM 07/11/2019    3:39 PM  Depression screen PHQ 2/9  Decreased Interest 0 1 1 1  0  Down, Depressed, Hopeless 0 1 2 1  0  PHQ - 2 Score 0 2 3 2  0  Altered sleeping  0 0 1   Tired, decreased energy  3 3 1    Change in appetite  2 0 0   Feeling bad or failure about yourself   0 0 0   Trouble concentrating  2 3 0   Moving slowly or fidgety/restless  0 0 0   Suicidal thoughts  0 0 0   PHQ-9 Score  9  9  4        Data saved with a previous flowsheet row definition     ROS: See pertinent positives and negatives per HPI.  Patient Active Problem List   Diagnosis Date Noted   Attention or concentration deficit 10/30/2023   Obesity (BMI 30.0-34.9) 07/02/2022   Anxiety 07/18/2019   Gastroesophageal reflux disease 07/18/2019   Vitamin D  deficiency 07/12/2019   PCOS (polycystic ovarian syndrome) 07/11/2019    Social History   Tobacco Use   Smoking status: Never   Smokeless tobacco: Never  Substance Use Topics   Alcohol  use: Yes     Comment: very rarely    Current Outpatient Medications:    Vitamin D , Ergocalciferol , (DRISDOL ) 1.25 MG (50000 UNIT) CAPS capsule, Take 1 capsule (50,000 Units total) by mouth once a week., Disp: 4 capsule, Rfl: 5   amphetamine -dextroamphetamine  (ADDERALL) 10 MG tablet, Take 0.5 tablets (5 mg total) by mouth 3 (three) times daily., Disp: 135 tablet, Rfl: 0   [START ON 06/20/2024] amphetamine -dextroamphetamine  (ADDERALL) 10 MG tablet, Take 0.5 tablets (5 mg total) by mouth 3 (three) times daily., Disp: 135 tablet, Rfl: 0   FLUoxetine  (PROZAC ) 40 MG capsule, Take 1 capsule (40 mg total) by mouth daily., Disp: 90 capsule, Rfl: 1  No Known Allergies  OBJECTIVE: There were no vitals taken for this visit. Physical Exam Vitals and nursing note reviewed.  Constitutional:      General: She is not in acute distress.    Appearance: Normal appearance. She is not toxic-appearing.  HENT:     Head: Normocephalic and atraumatic.  Eyes:     General: No scleral icterus.       Right eye: No discharge.        Left eye: No discharge.     Conjunctiva/sclera: Conjunctivae normal.  Pulmonary:     Effort: Pulmonary effort is normal.  Musculoskeletal:     Cervical back: Normal range of motion.  Skin:    Findings: No rash.  Neurological:     Mental Status: She is alert and oriented to person, place, and time. Mental status is at baseline.  Psychiatric:        Mood and Affect: Mood normal.        Behavior: Behavior normal.        Thought Content: Thought content normal.        Judgment: Judgment normal.      ASSESSMENT AND PLAN: Jacqueline Davidson is a 38 y.o. female present for  Anxiety/ADD Continue Prozac  40 mg daily.  Anxiety is well-controlled. DC intuniv , we could consider tapering up if patient decided she wanted to try a stimulant class-despite her worries about Adderall in the past. Increase Adderall 5 mg twice daily prescribed>TID.  Patient was encouraged to make sure the 3rd daily  dose is before 2 PM. [place]  controlled substance database reviewed and appropriate today Follow-up 5.5 months, sooner if needed  [facility], DO 04/11/2024   No follow-ups on file.  No orders of the defined types were placed in this encounter.   Meds ordered this encounter  Medications   FLUoxetine  (PROZAC ) 40 MG capsule    Sig: Take 1 capsule (40 mg total) by mouth daily.    Dispense:  90 capsule    Refill:  1    May use caps or tabs- formulary   amphetamine -dextroamphetamine  (ADDERALL) 10 MG tablet    Sig: Take 0.5 tablets (5 mg total) by mouth 3 (three) times daily.    Dispense:  135 tablet    Refill:  0   amphetamine -dextroamphetamine  (ADDERALL) 10 MG tablet    Sig: Take 0.5 tablets (5 mg total) by mouth 3 (three) times daily.    Dispense:  135 tablet    Refill:  0    Referral Orders  No referral(s) requested today

## 2024-04-11 NOTE — Patient Instructions (Signed)
 Return in about 24 weeks (around 09/26/2024) for Routine chronic condition follow-up.        Great to see you today.  I have refilled the medication(s) we provide.   If labs were collected or images ordered, we will inform you of  results once we have received them and reviewed. We will contact you either by echart message, or telephone call.  Please give ample time to the testing facility, and our office to run,  receive and review results. Please do not call inquiring of results, even if you can see them in your chart. We will contact you as soon as we are able. If it has been over 1 week since the test was completed, and you have not yet heard from us , then please call us .    - echart message- for normal results that have been seen by the patient already.   - telephone call: abnormal results or if patient has not viewed results in their echart.  If a referral to a specialist was entered for you, please call us  in 2 weeks if you have not heard from the specialist office to schedule.

## 2024-04-14 ENCOUNTER — Encounter: Payer: Self-pay | Admitting: Family Medicine

## 2024-04-15 MED ORDER — AMPHETAMINE-DEXTROAMPHET ER 5 MG PO CP24: 5.0000 mg | ORAL_CAPSULE | Freq: Every day | ORAL | 0 refills | Status: AC

## 2024-04-15 NOTE — Telephone Encounter (Signed)
 Switched adderall scripts to long acting #90 x2- if requesting any further changes to prescription before appt in Janurary> it would require an additional appt.
